# Patient Record
Sex: Female | Born: 1935 | Hispanic: No | Smoking: Never smoker
Health system: Southern US, Community
[De-identification: ages and names within clinical notes are randomized; demographics above are authoritative.]

## PROBLEM LIST (undated history)

## (undated) DIAGNOSIS — Z0000 Encounter for general adult medical examination without abnormal findings: Secondary | ICD-10-CM

## (undated) DIAGNOSIS — G4700 Insomnia, unspecified: Secondary | ICD-10-CM

## (undated) DIAGNOSIS — D649 Anemia, unspecified: Secondary | ICD-10-CM

## (undated) DIAGNOSIS — E785 Hyperlipidemia, unspecified: Secondary | ICD-10-CM

## (undated) DIAGNOSIS — R32 Unspecified urinary incontinence: Secondary | ICD-10-CM

## (undated) DIAGNOSIS — M25531 Pain in right wrist: Secondary | ICD-10-CM

## (undated) DIAGNOSIS — K1120 Sialoadenitis, unspecified: Secondary | ICD-10-CM

## (undated) DIAGNOSIS — Z124 Encounter for screening for malignant neoplasm of cervix: Secondary | ICD-10-CM

## (undated) DIAGNOSIS — F329 Major depressive disorder, single episode, unspecified: Secondary | ICD-10-CM

## (undated) DIAGNOSIS — E538 Deficiency of other specified B group vitamins: Secondary | ICD-10-CM

## (undated) HISTORY — DX: Encounter for screening for malignant neoplasm of cervix: Z12.4

## (undated) HISTORY — PX: CATARACT EXTRACTION: SUR2

## (undated) HISTORY — DX: Anemia, unspecified: D64.9

## (undated) HISTORY — DX: Insomnia, unspecified: G47.00

## (undated) HISTORY — DX: Encounter for general adult medical examination without abnormal findings: Z00.00

## (undated) HISTORY — DX: Major depressive disorder, single episode, unspecified: F32.9

## (undated) HISTORY — DX: Deficiency of other specified B group vitamins: E53.8

## (undated) HISTORY — DX: Unspecified urinary incontinence: R32

## (undated) HISTORY — DX: Hyperlipidemia, unspecified: E78.5

## (undated) HISTORY — DX: Pain in right wrist: M25.531

## (undated) HISTORY — PX: HEMORRHOID SURGERY: SHX153

## (undated) HISTORY — PX: TONSILLECTOMY: SHX5217

## (undated) HISTORY — DX: Sialoadenitis, unspecified: K11.20

---

## 2003-07-17 LAB — HM COLONOSCOPY

## 2006-09-12 ENCOUNTER — Encounter: Payer: Self-pay | Admitting: Family Medicine

## 2009-05-03 ENCOUNTER — Encounter: Payer: Self-pay | Admitting: Family Medicine

## 2009-05-16 LAB — HM MAMMOGRAPHY

## 2009-10-17 ENCOUNTER — Encounter: Payer: Self-pay | Admitting: Family Medicine

## 2010-02-07 ENCOUNTER — Ambulatory Visit: Payer: Self-pay | Admitting: Family Medicine

## 2010-02-07 DIAGNOSIS — M810 Age-related osteoporosis without current pathological fracture: Secondary | ICD-10-CM

## 2010-02-07 DIAGNOSIS — E782 Mixed hyperlipidemia: Secondary | ICD-10-CM | POA: Insufficient documentation

## 2010-02-07 DIAGNOSIS — Z87898 Personal history of other specified conditions: Secondary | ICD-10-CM | POA: Insufficient documentation

## 2010-02-07 DIAGNOSIS — Z8639 Personal history of other endocrine, nutritional and metabolic disease: Secondary | ICD-10-CM

## 2010-02-07 DIAGNOSIS — Z862 Personal history of diseases of the blood and blood-forming organs and certain disorders involving the immune mechanism: Secondary | ICD-10-CM

## 2010-02-07 DIAGNOSIS — I10 Essential (primary) hypertension: Secondary | ICD-10-CM

## 2010-02-15 ENCOUNTER — Telehealth: Payer: Self-pay | Admitting: Family Medicine

## 2010-02-24 ENCOUNTER — Ambulatory Visit: Payer: Self-pay | Admitting: Family Medicine

## 2010-02-27 ENCOUNTER — Telehealth: Payer: Self-pay | Admitting: *Deleted

## 2010-02-27 LAB — CONVERTED CEMR LAB
ALT: 23 units/L (ref 0–35)
Albumin: 4.1 g/dL (ref 3.5–5.2)
BUN: 17 mg/dL (ref 6–23)
Basophils Relative: 0.3 % (ref 0.0–3.0)
CO2: 31 meq/L (ref 19–32)
Chloride: 104 meq/L (ref 96–112)
Cholesterol: 115 mg/dL (ref 0–200)
Eosinophils Relative: 0.5 % (ref 0.0–5.0)
HCT: 36.3 % (ref 36.0–46.0)
LDL Cholesterol: 63 mg/dL (ref 0–99)
Lymphs Abs: 2 10*3/uL (ref 0.7–4.0)
MCV: 91.2 fL (ref 78.0–100.0)
Monocytes Absolute: 0.5 10*3/uL (ref 0.1–1.0)
Potassium: 4.3 meq/L (ref 3.5–5.1)
RBC: 3.98 M/uL (ref 3.87–5.11)
TSH: 2.33 microintl units/mL (ref 0.35–5.50)
Total Protein: 6.4 g/dL (ref 6.0–8.3)
WBC: 5.4 10*3/uL (ref 4.5–10.5)

## 2010-03-03 ENCOUNTER — Ambulatory Visit: Payer: Self-pay | Admitting: Family Medicine

## 2010-03-03 DIAGNOSIS — E559 Vitamin D deficiency, unspecified: Secondary | ICD-10-CM

## 2010-05-18 ENCOUNTER — Ambulatory Visit: Payer: Self-pay | Admitting: Family Medicine

## 2010-05-18 LAB — CONVERTED CEMR LAB
ALT: 21 units/L (ref 0–35)
BUN: 15 mg/dL (ref 6–23)
CO2: 34 meq/L — ABNORMAL HIGH (ref 19–32)
Chloride: 105 meq/L (ref 96–112)
Cholesterol: 132 mg/dL (ref 0–200)
Eosinophils Relative: 0.6 % (ref 0.0–5.0)
Glucose, Bld: 82 mg/dL (ref 70–99)
HCT: 35.5 % — ABNORMAL LOW (ref 36.0–46.0)
Lymphs Abs: 2 10*3/uL (ref 0.7–4.0)
MCV: 91.8 fL (ref 78.0–100.0)
Monocytes Absolute: 0.5 10*3/uL (ref 0.1–1.0)
Platelets: 215 10*3/uL (ref 150.0–400.0)
Potassium: 4.1 meq/L (ref 3.5–5.1)
RDW: 14.6 % (ref 11.5–14.6)
Total Bilirubin: 0.4 mg/dL (ref 0.3–1.2)
WBC: 5.3 10*3/uL (ref 4.5–10.5)

## 2010-05-24 ENCOUNTER — Ambulatory Visit: Payer: Self-pay | Admitting: Family Medicine

## 2010-05-29 ENCOUNTER — Telehealth (INDEPENDENT_AMBULATORY_CARE_PROVIDER_SITE_OTHER): Payer: Self-pay | Admitting: *Deleted

## 2010-07-03 ENCOUNTER — Encounter: Payer: Self-pay | Admitting: Family Medicine

## 2010-08-09 ENCOUNTER — Telehealth (INDEPENDENT_AMBULATORY_CARE_PROVIDER_SITE_OTHER): Payer: Self-pay | Admitting: *Deleted

## 2010-08-15 NOTE — Assessment & Plan Note (Signed)
Summary: TO BE EST/NJR   Vital Signs:  Patient profile:   77 year old female Height:      65.5 inches (166.37 cm) Weight:      134 pounds (60.91 kg) BMI:     22.04 O2 Sat:      98 % on Room air Temp:     98.8 degrees F (37.11 degrees C) oral Pulse rate:   86 / minute BP sitting:   152 / 82  (left arm) Cuff size:   regular  Vitals Entered By: Josph Macho RMA (February 07, 2010 9:35 AM)  O2 Flow:  Room air CC: Establish new pt/ CF Is Patient Diabetic? No   History of Present Illness: Patient in today for new patient appt. Patient generally doing very well. No recent illness/f/c/CP/palp/SOB/GI or GU c/o. Notes occasional very mild constipation for which she uses prunes to good effect. Her biggest issue is her long term physician died suddenly and she needs a PMD and her husband's health continues to fail and she has had to place him in a facility. He has has advanced dementia and Parkinson's Disease and she could no longer manage him. She acknowledges feeling sad and guilty at times but understands that it was necessary.  Preventive Screening-Counseling & Management  Alcohol-Tobacco     Smoking Status: never  Caffeine-Diet-Exercise     Does Patient Exercise: yes  Safety-Violence-Falls     Seat Belt Use: yes      Drug Use:  no.    Current Medications (verified): 1)  Alendronate Sodium 70 Mg Tabs (Alendronate Sodium) .... One Weekly 2)  Hydrochlorothiazide 25 Mg Tabs (Hydrochlorothiazide) .... Once Daily 3)  Diovan 160 Mg Tabs (Valsartan) .... Once Daily 4)  Lipitor 20 Mg Tabs (Atorvastatin Calcium) .... Once Daily At Bedtime 5)  Diltiazem Hcl Er Beads 120 Mg Xr24h-Cap (Diltiazem Hcl Er Beads) .... Once Daily 6)  Klor-Con 10 10 Meq Cr-Tabs (Potassium Chloride) .... Once Daily 7)  Calcium 600mg  + D3 .... 2 in Morning  Allergies (verified): No Known Drug Allergies  Past History:  Past Surgical History: Tonsillectomy Hemorrhoidectomy Cataract extraction b/l  Family  History: Father: deceased@70 , hepatic cancer, cigar smoker Mother: deceased@95 , hip fx @ 85, heart disease with Mitral valve replacement. overweight Siblings:  Brother: deceased@78 , esophageal cancer, smoker, glaucoma MGM: deceased@101 , hip fracture, bladder prolapse MGF: deceased@38 , accidental death PGM: deceased in late 69s, old age PGF: deceased in late 46s, glaucoma, hip fracture Children: Daughter: 46, [employer], early hysterectomy for menometroraghia Son: 3, recurrent DVTs Son: 31, recurrent DVTs Son: 66, [employer]  Social History: Retired from Theatre manager, Marine scientist, homemaker Married, husband now in assisted living due to Parkinson's Disease, Dementia Never Smoked Alcohol use-yes, rare Drug use-no Regular exercise-yes, bicycle Smoking Status:  never Drug Use:  no Does Patient Exercise:  yes Seat Belt Use:  yes Alcohol:  rare  Review of Systems  The patient denies anorexia, fever, weight loss, weight gain, vision loss, decreased hearing, hoarseness, chest pain, syncope, dyspnea on exertion, peripheral edema, prolonged cough, headaches, hemoptysis, abdominal pain, melena, hematochezia, severe indigestion/heartburn, hematuria, incontinence, genital sores, muscle weakness, suspicious skin lesions, transient blindness, difficulty walking, depression, unusual weight change, abnormal bleeding, and enlarged lymph nodes.    Physical Exam  General:  Well-developed,well-nourished,in no acute distress; alert,appropriate and cooperative throughout examination Head:  Normocephalic and atraumatic without obvious abnormalities. Eyes:  No corneal or conjunctival inflammation noted. EOMI. Perrla. Funduscopic exam benign, without  Ears:  External ear exam shows no significant lesions  or deformities.  Otoscopic examination reveals clear canals, tympanic membranes are intact bilaterally without bulging, retraction, inflammation or discharge. Hearing is grossly normal bilaterally. Nose:   External nasal examination shows no deformity or inflammation. Nasal mucosa are pink and moist without lesions or exudates. Mouth:  Oral mucosa and oropharynx without lesions or exudates.  Teeth in good repair. Neck:  No deformities, masses, or tenderness noted. Lungs:  Normal respiratory effort, chest expands symmetrically. Lungs are clear to auscultation, no crackles or wheezes. Heart:  Normal rate and regular rhythm. S1 and S2 normal without gallop, murmur, click, rub or other extra sounds. Abdomen:  Bowel sounds positive,abdomen soft and non-tender without masses, organomegaly or hernias noted. Msk:  No deformity or scoliosis noted of thoracic or lumbar spine.   Pulses:  R and L carotid, dorsalis pedis and posterior tibial pulses are full and equal bilaterally Extremities:  No clubbing, cyanosis, edema, or deformity noted with normal full range of motion of all joints.   Neurologic:  No cranial nerve deficits noted. Station and gait are normal. Plantar reflexes are down-going bilaterally. DTRs are symmetrical throughout. Sensory, motor and coordinative functions appear intact. Skin:  seborrheic keratosis diffusely Cervical Nodes:  No lymphadenopathy noted Psych:  Cognition and judgment appear intact. Alert and cooperative with normal attention span and concentration. No apparent delusions, illusions, hallucinations   Impression & Recommendations:  Problem # 1:  ESSENTIAL HYPERTENSION, BENIGN (ICD-401.1)  Her updated medication list for this problem includes:    Hydrochlorothiazide 25 Mg Tabs (Hydrochlorothiazide) ..... Once daily    Diovan 160 Mg Tabs (Valsartan) ..... Once daily    Diltiazem Hcl Er Beads 120 Mg Xr24h-cap (Diltiazem hcl er beads) ..... Once daily Mild elevation here but reports systolic generally in 130s when checked at home. Avoid sodium and monitor  Problem # 2:  OTHER OSTEOPOROSIS (ICD-733.09)  Her updated medication list for this problem includes:    Alendronate  Sodium 70 Mg Tabs (Alendronate sodium) ..... One weekly Cont med, encouraged her to increase her Calcium Citrate supplement to bid  Problem # 3:  HYPOKALEMIA, HX OF (ICD-V12.2) Check a renal panel prior to next visit and request old records  Problem # 4:  MIGRAINES, HX OF (ICD-V13.8) No trouble since age 13 likely hormone mediated. Will monitor  Problem # 5:  MIXED HYPERLIPIDEMIA (ICD-272.2)  Her updated medication list for this problem includes:    Lipitor 20 Mg Tabs (Atorvastatin calcium) ..... Once daily at bedtime Avoid trans fats, request old records and cont current meds til next lab work completed  Complete Medication List: 1)  Alendronate Sodium 70 Mg Tabs (Alendronate sodium) .... One weekly 2)  Hydrochlorothiazide 25 Mg Tabs (Hydrochlorothiazide) .... Once daily 3)  Diovan 160 Mg Tabs (Valsartan) .... Once daily 4)  Lipitor 20 Mg Tabs (Atorvastatin calcium) .... Once daily at bedtime 5)  Diltiazem Hcl Er Beads 120 Mg Xr24h-cap (Diltiazem hcl er beads) .... Once daily 6)  Klor-con 10 10 Meq Cr-tabs (Potassium chloride) .... Once daily 7)  Calcium 600mg  + D3  .... 2 in morning  Patient Instructions: 1)  Please schedule a follow-up appointment in 1 month.  2)  Limit your Sodium(salt) .  3)  BMP prior to visit, ICD-9: 401.1 4)  Hepatic Panel prior to visit ICD-9: 272.0 5)  Lipid panel prior to visit ICD-9 :272.0  6)  TSH prior to visit ICD-9 : 401.1 7)  CBC w/ Diff prior to visit ICD-9 : 401.1 8)  Release of Records if  not already done Dr Jerelyn Scott in [facility]  Preventive Care Screening  Hemoccult:    Date:  05/16/2009    Results:  historical   Mammogram:    Date:  05/16/2009    Results:  historical   Pap Smear:    Date:  05/16/2009    Results:  historical   Last Flu Shot:    Date:  04/15/2009    Results:  hisotrical   Bone Density:    Date:  07/17/2003    Results:  historical std dev  Colonoscopy:    Date:  07/17/2003    Results:  historical

## 2010-08-15 NOTE — Progress Notes (Signed)
Summary: wants screening mammogram  Phone Note Call from Patient Call back at Work Phone [phone]   Caller: Patient---live call Reason for Call: Referral Summary of Call: [facility] please send [facility]. Initial call taken by: Warnell Forester,  May 29, 2010 9:04 AM  Follow-up for Phone Call        Just need to know if she has a preference for [facility] or [facility]? If no preference will do [facility] and can order Follow-up by: Danise Edge MD,  May 29, 2010 9:52 AM  Additional Follow-up for Phone Call Additional follow up Details #1::        Patient states she would like to go to [facility] Additional Follow-up by: Josph Macho RMA,  May 29, 2010 1:29 PM    Additional Follow-up for Phone Call Additional follow up Details #2::    Order has been written and is ready for p/u.  Follow-up by: Danise Edge MD,  May 29, 2010 2:12 PM  Additional Follow-up for Phone Call Additional follow up Details #3:: Details for Additional Follow-up Action Taken: Mailed out referral per pt Additional Follow-up by: Josph Macho RMA,  May 29, 2010 2:25 PM

## 2010-08-15 NOTE — Letter (Signed)
Summary: Imaging Reports -[facility] -2005 - 2010  Imaging Reports -[facility] -2005 - 2010   Imported By: Maryln Gottron 03/10/2010 10:29:34  _____________________________________________________________________  External Attachment:    Type:   Image     Comment:   External Document

## 2010-08-15 NOTE — Assessment & Plan Note (Signed)
Summary: 3 month rov/njr   Vital Signs:  Patient profile:   77 year old female Height:      65.5 inches (166.37 cm) Weight:      133.31 pounds (60.60 kg) O2 Sat:      98 % on Room air Temp:     98.2 degrees F (36.78 degrees C) oral Pulse rate:   94 / minute BP sitting:   116 / 62  (left arm) Cuff size:   regular  Vitals Entered By: Josph Macho RMA (May 24, 2010 2:29 PM)  O2 Flow:  Room air CC: 3 month follow up/ CF Is Patient Diabetic? No   History of Present Illness: patient is an 77 year old female in today for evaluation of her medical problems. She denies any acute complaints she is feeling well. 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 She goes daily to his facility and cares for him 14-16 hours a day. She says her health is good she is eating better. Her complaint is of some intermittent left elbow painat her lateral epicondyle secondary to some heavy lifting and shifting of her husband. No swelling, erythema is noted no other injury. She says she feels well no chest pain, palpitations, shortness of breath, fevers, chills, GI or GU complaints and she says she checks her blood pressure at home and has seen a  Current Medications (verified): 1)  Diovan 160 Mg Tabs (Valsartan) .... Once Daily 2)  Diltiazem Hcl Er Beads 120 Mg Xr24h-Cap (Diltiazem Hcl Er Beads) .... Once Daily 3)  Calcium 600mg  + D3 .... 2 in Morning 4)  Lipitor 10 Mg Tabs (Atorvastatin Calcium) 5)  Ra Fish Oil 1000 Mg Caps (Omega-3 Fatty Acids) .Marland Kitchen.. 1 Tab By Mouth Daily  Allergies (verified): No Known Drug Allergies  Past History:  Past medical history reviewed for relevance to current acute and chronic problems. Social history (including risk factors) reviewed for relevance to current acute and chronic problems.  Social History: Reviewed history from 02/07/2010 and no changes required. Retired from Theatre manager, Marine scientist,  homemaker Married, husband now in assisted living due to Parkinson's Disease, Dementia Never Smoked Alcohol use-yes, rare Drug use-no Regular exercise-yes, bicycle  Review of Systems      See HPI       Flu Vaccine Consent Questions     Do you have a history of severe allergic reactions to this vaccine? no    Any prior history of allergic reactions to egg and/or gelatin? no    Do you have a sensitivity to the preservative Thimersol? no    Do you have a past history of Guillan-Barre Syndrome? no    Do you currently have an acute febrile illness? no    Have you ever had a severe reaction to latex? no    Vaccine information given and explained to patient? yes    Are you currently pregnant? no    Lot Number:[id number]   Exp Date:01/13/2011   Site Given  Left Deltoid IM Josph Macho RMA  May 24, 2010 2:30 PM   Physical Exam  General:  Well-developed,well-nourished,in no acute distress; alert,appropriate and cooperative throughout examination Head:  Normocephalic and atraumatic without obvious abnormalities. No apparent alopecia or balding. Mouth:  Oral mucosa and oropharynx without lesions or exudates.  Teeth in good repair. Neck:  No deformities, masses, or tenderness noted. Lungs:  Normal respiratory effort, chest expands symmetrically. Lungs are clear to auscultation, no crackles or  wheezes. Heart:  Normal rate and regular rhythm. S1 and S2 normal without gallop, murmur, click, rub or other extra sounds. Abdomen:  Bowel sounds positive,abdomen soft and non-tender without masses, organomegaly or hernias noted. Extremities:  No clubbing, cyanosis, edema, or deformity noted with normal full range of motion of all joints.   Cervical Nodes:  No lymphadenopathy noted Psych:  Cognition and judgment appear intact. Alert and cooperative with normal attention span and concentration. No apparent delusions, illusions, hallucinations   Impression & Recommendations:  Problem # 1:   MIXED HYPERLIPIDEMIA (ICD-272.2)  Her updated medication list for this problem includes:    Lipitor 10 Mg Tabs (Atorvastatin calcium) Good control, avoid trans fats, cont fish oil  Problem # 2:  ESSENTIAL HYPERTENSION, BENIGN (ICD-401.1)  Her updated medication list for this problem includes:    Diovan 160 Mg Tabs (Valsartan) ..... Once daily    Diltiazem Hcl Er Beads 120 Mg Xr24h-cap (Diltiazem hcl er beads) .Marland Kitchen... 1 tab by mouth once daily    Hydrochlorothiazide 25 Mg Tabs (Hydrochlorothiazide) .Marland Kitchen... 1 tab by mouth daily Well controlled no changes  Problem # 3:  OTHER OSTEOPOROSIS (ICD-733.09)  The following medications were removed from the medication list:    Alendronate Sodium 70 Mg Tabs (Alendronate sodium) ..... One weekly Has been on the above medication for over 10 years so agree with stopping med at this time, continuing calcium supplements and monitoring bone density.  Complete Medication List: 1)  Diovan 160 Mg Tabs (Valsartan) .... Once daily 2)  Diltiazem Hcl Er Beads 120 Mg Xr24h-cap (Diltiazem hcl er beads) .Marland Kitchen.. 1 tab by mouth once daily 3)  Calcium 600mg  + D3  .... 2 in morning 4)  Lipitor 10 Mg Tabs (Atorvastatin calcium) 5)  Ra Fish Oil 1000 Mg Caps (Omega-3 fatty acids) .Marland Kitchen.. 1 tab by mouth daily 6)  Hydrochlorothiazide 25 Mg Tabs (Hydrochlorothiazide) .Marland Kitchen.. 1 tab by mouth daily  Other Orders: Flu Vaccine 44yrs + MEDICARE PATIENTS (I4332) Administration Flu vaccine - MCR (R5188)  Patient Instructions: 1)  Please schedule a follow-up appointment in 3 months 2)   or as needed Prescriptions: HYDROCHLOROTHIAZIDE 25 MG TABS (HYDROCHLOROTHIAZIDE) 1 tab by mouth daily  #90 x 0   Entered and Authorized by:   Danise Edge MD   Signed by:   Danise Edge MD on 05/24/2010   Method used:   Faxed to ...       [facility] (retail)       [address]       [place], [place]  [address]  [place]       Ph: [phone]       Fax: [phone]   RxID:    [id number] DILTIAZEM HCL ER BEADS 120 MG XR24H-CAP (DILTIAZEM HCL ER BEADS) 1 tab by mouth once daily  #90 x 0   Entered and Authorized by:   Danise Edge MD   Signed by:   Danise Edge MD on 05/24/2010   Method used:   Faxed to ...       [facility] (retail)       [address]       [place], [place]  [address]  [place]       Ph: [phone]       Fax: [phone]   RxID:   [id number]    Orders Added: 1)  Flu Vaccine 8yrs + MEDICARE PATIENTS [Q2039] 2)  Administration Flu vaccine - MCR [G0008] 3)  Est. Patient Level IV [89381]

## 2010-08-15 NOTE — Progress Notes (Signed)
Summary: refills  Phone Note Refill Request Call back at Work Phone [phone]   Refills Requested: Medication #1:  ALENDRONATE SODIUM 70 MG TABS one weekly  Medication #2:  HYDROCHLOROTHIAZIDE 25 MG TABS once daily send to [facility]  Initial call taken by: Warnell Forester,  February 15, 2010 12:41 PM    Prescriptions: HYDROCHLOROTHIAZIDE 25 MG TABS (HYDROCHLOROTHIAZIDE) once daily  #90 x 1   Entered by:   Josph Macho RMA   Authorized by:   Danise Edge MD   Signed by:   Josph Macho RMA on 02/15/2010   Method used:   Faxed to ...       [facility] (mail-order)             , [place]         Ph: [phone]       Fax: [phone]   RxID:   [id number] ALENDRONATE SODIUM 70 MG TABS (ALENDRONATE SODIUM) one weekly  #12 x 1   Entered by:   Josph Macho RMA   Authorized by:   Danise Edge MD   Signed by:   Josph Macho RMA on 02/15/2010   Method used:   Faxed to ...       [facility] (mail-order)             , [place]         Ph: [phone]       Fax: [phone]   RxID:   [id number]

## 2010-08-15 NOTE — Letter (Signed)
Summary: Office [facility]- 2005 - 2010  Office [facility]- 2005 - 2010   Imported By: Maryln Gottron 03/10/2010 10:20:13  _____________________________________________________________________  External Attachment:    Type:   Image     Comment:   External Document

## 2010-08-15 NOTE — Progress Notes (Signed)
Summary: refill  Phone Note Refill Request Call back at Home Phone [phone]   Refills Requested: Medication #1:  DIOVAN 160 MG TABS once daily send to [facility]  Initial call taken by: Warnell Forester,  February 15, 2010 4:27 PM    Prescriptions: DIOVAN 160 MG TABS (VALSARTAN) once daily  #90 x 1   Entered by:   Josph Macho RMA   Authorized by:   Danise Edge MD   Signed by:   Josph Macho RMA on 02/15/2010   Method used:   Faxed to ...       [facility] (mail-order)             , [place]         Ph: [phone]       Fax: [phone]   RxID:   [id number]

## 2010-08-15 NOTE — Letter (Signed)
Summary: Lab Reports - [facility] 2005 - 2011  Lab Reports - [facility] 2005 - 2011   Imported By: Maryln Gottron 03/10/2010 10:23:03  _____________________________________________________________________  External Attachment:    Type:   Image     Comment:   External Document

## 2010-08-15 NOTE — Assessment & Plan Note (Signed)
Summary: 1 month fup//ccm   Vital Signs:  Patient profile:   77 year old female Height:      65.5 inches (166.37 cm) Weight:      132 pounds (60.00 kg) O2 Sat:      98 % on Room air Temp:     98.7 degrees F (37.06 degrees C) oral Pulse rate:   93 / minute BP sitting:   110 / 62  (left arm)  Vitals Entered By: Josph Macho RMA (March 03, 2010 2:05 PM)  O2 Flow:  Room air CC: 1 month follow up/ CF Is Patient Diabetic? No   History of Present Illness: Patient in today for follow up of her new patient appointment. She is feeling well. No recent illness/f/c/malaise/CP/palp/SOB/GI or GU c/o. Review of her old records and discussion with the patient tody reveal she has been on Fosamax and/or Alendronate for over 10 years and has not had a bone scan in many years.   Current Medications (verified): 1)  Alendronate Sodium 70 Mg Tabs (Alendronate Sodium) .... One Weekly 2)  Hydrochlorothiazide 25 Mg Tabs (Hydrochlorothiazide) .... Once Daily 3)  Diovan 160 Mg Tabs (Valsartan) .... Once Daily 4)  Lipitor 20 Mg Tabs (Atorvastatin Calcium) .... Once Daily At Bedtime 5)  Diltiazem Hcl Er Beads 120 Mg Xr24h-Cap (Diltiazem Hcl Er Beads) .... Once Daily 6)  Klor-Con 10 10 Meq Cr-Tabs (Potassium Chloride) .... Once Daily 7)  Calcium 600mg  + D3 .... 2 in Morning  Allergies (verified): No Known Drug Allergies  Past History:  Past medical history reviewed for relevance to current acute and chronic problems. Social history (including risk factors) reviewed for relevance to current acute and chronic problems.  Social History: Reviewed history from 02/07/2010 and no changes required. 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 No apparent alopecia or balding. Mouth:  Oral mucosa and oropharynx without lesions or exudates.  Teeth in good repair. Neck:  No deformities, masses, or tenderness noted. Lungs:  Normal respiratory effort, chest expands symmetrically. Lungs are clear to auscultation, no crackles or wheezes. Heart:  Normal rate and regular rhythm. S1 and S2 normal without gallop, murmur, click, rub or other extra sounds. Abdomen:  Bowel sounds positive,abdomen soft and non-tender without masses, organomegaly or hernias noted. Msk:  No deformity or scoliosis noted of thoracic or lumbar spine.   Extremities:  No clubbing, cyanosis, edema, or deformity noted    Skin:  Intact without suspicious lesions or rashes Cervical Nodes:  No lymphadenopathy noted Psych:  Cognition and judgment appear intact. Alert and cooperative with normal attention span and concentration. No apparent delusions, illusions, hallucinations   Impression & Recommendations:  Problem # 1:  MIXED HYPERLIPIDEMIA (ICD-272.2)  The following medications were removed from the medication list:    Lipitor 20 Mg Tabs (Atorvastatin calcium) ..... Once daily at bedtime Her updated medication list for this problem includes:    Lipitor 10 Mg Tabs (Atorvastatin calcium) Good control will decrease Lipitor dose and add a fish oil cap. Avoid trans fats and recheck level in 3 months  Problem # 2:  ESSENTIAL HYPERTENSION, BENIGN (ICD-401.1)  The following medications were removed from the medication list:  Hydrochlorothiazide 25 Mg Tabs (Hydrochlorothiazide) ..... Once daily Her updated medication list for this problem includes:    Diovan 160 Mg Tabs (Valsartan) ..... Once daily    Diltiazem Hcl Er Beads 120 Mg Xr24h-cap (Diltiazem hcl er beads) ..... Once daily Well controlled patient would like to decrease  meds, will try to d/c HCTZ and reeval at next visit  Problem # 3:  HYPOKALEMIA, HX OF (ICD-V12.2) Related to HCTZ use, will d/c and check renal with next visit  Problem # 4:  OTHER OSTEOPOROSIS (ICD-733.09)  Her updated medication list for this problem includes:    Alendronate Sodium 70 Mg Tabs (Alendronate sodium) ..... One weekly Records note osteopenia, will repeat Bone densitometry and then likely stop Alendronate cont Ca and vit D supplements  Complete Medication List: 1)  Alendronate Sodium 70 Mg Tabs (Alendronate sodium) .... One weekly 2)  Diovan 160 Mg Tabs (Valsartan) .... Once daily 3)  Diltiazem Hcl Er Beads 120 Mg Xr24h-cap (Diltiazem hcl er beads) .... Once daily 4)  Calcium 600mg  + D3  .... 2 in morning 5)  Lipitor 10 Mg Tabs (Atorvastatin calcium) 6)  Ra Fish Oil 1000 Mg Caps (Omega-3 fatty acids) .Marland Kitchen.. 1 tab by mouth daily  Other Orders: T-Bone Densitometry [phone])  Patient Instructions: 1)  Please schedule a follow-up appointment in 3 months .  2)  BMP prior to visit, ICD-9: 401.1 3)  Hepatic Panel prior to visit ICD-9: 401.1 4)  Lipid panel prior to visit ICD-9 : 272.2 5)  CBC w/ Diff prior to visit ICD-9 : 401.1 6)  Stop Hydrochlorthiazide and potassium, split Lipitor in 1/2. 7)  Start one fish oil cap daily

## 2010-08-15 NOTE — Letter (Signed)
Summary: EKG, Echo [facility] -2008  EKG, Echo [facility] -2008   Imported By: Maryln Gottron 03/10/2010 10:25:52  _____________________________________________________________________  External Attachment:    Type:   Image     Comment:   External Document

## 2010-08-15 NOTE — Progress Notes (Signed)
Summary: rtc  Phone Note Call from Patient Call back at Home Phone [phone] Call back at Work Phone [phone]   Caller: Patient Call For: Lori Edge MD Summary of Call: pt is return Lori Bright call  Initial call taken by: Heron Sabins,  February 27, 2010 12:03 PM  Follow-up for Phone Call        Pt aware of results. Follow-up by: Romualdo Bolk, CMA Duncan Dull),  February 27, 2010 2:47 PM

## 2010-08-17 NOTE — Progress Notes (Signed)
Summary: Lab work  Phone Note Call from Patient Call back at [facility] [phone]   Summary of Call: Pt would like to know if she needs lab work done before next appt? If so can we put the orders in for [facility] labs a week before appt. Call pt back and let her know. Initial call taken by: Josph Macho RMA,  August 09, 2010 9:14 AM  Follow-up for Phone Call        No labs this time, her labs were very good last time, will check a CBC again due to her very mild anemia but not until next vist.  Follow-up by: Danise Edge MD,  August 09, 2010 12:01 PM  Additional Follow-up for Phone Call Additional follow up Details #1::        Pt informed Additional Follow-up by: Josph Macho RMA,  August 09, 2010 1:12 PM

## 2010-08-28 ENCOUNTER — Telehealth: Payer: Self-pay | Admitting: Family Medicine

## 2010-08-31 ENCOUNTER — Telehealth: Payer: Self-pay | Admitting: Family Medicine

## 2010-09-06 NOTE — Progress Notes (Signed)
Summary: Lipitor refill  Phone Note Refill Request Message from:  Patient on August 28, 2010 2:52 PM  Refills Requested: Medication #1:  LIPITOR 10 MG TABS   Dosage confirmed as above?Dosage Confirmed Pt wants paper RX  Initial call taken by: Josph Macho RMA,  August 28, 2010 2:52 PM    Prescriptions: LIPITOR 10 MG TABS (ATORVASTATIN CALCIUM)   #30 x 5   Entered by:   Josph Macho RMA   Authorized by:   Danise Edge MD   Signed by:   Josph Macho RMA on 08/28/2010   Method used:   Print then Give to Patient   RxID:   [id number] LIPITOR 10 MG TABS (ATORVASTATIN CALCIUM)   #30 x 5   Entered by:   Josph Macho RMA   Authorized by:   Danise Edge MD   Signed by:   Josph Macho RMA on 08/28/2010   Method used:   Faxed to ...       [facility] (mail-order)             , [place]         Ph: [phone]       Fax: [phone]   RxID:   [id number]   Appended Document: Lipitor refill RX sent to [facility] was cancelled and paper copy was given to pt

## 2010-09-06 NOTE — Progress Notes (Signed)
Summary: Lipitor refill to [facility] per pt  Phone Note Refill Request   Refills Requested: Medication #1:  LIPITOR 10 MG TABS   Dosage confirmed as above?Dosage Confirmed Pt called stating she wants generic Lipitor sent to [facility]   Initial call taken by: Josph Macho RMA,  August 31, 2010 10:56 AM Initial call taken by: Josph Macho RMA,  August 31, 2010 10:56 AM    Prescriptions: LIPITOR 10 MG TABS (ATORVASTATIN CALCIUM)   #30 x 5   Entered by:   Josph Macho RMA   Authorized by:   Danise Edge MD   Signed by:   Josph Macho RMA on 08/31/2010   Method used:   Faxed to ...       [facility] (mail-order)             , [place]         Ph: [phone]       Fax: [phone]   RxID:   [id number]

## 2010-09-19 ENCOUNTER — Telehealth: Payer: Self-pay | Admitting: Family Medicine

## 2010-09-26 NOTE — Progress Notes (Signed)
Summary: Diovan refill  Phone Note Refill Request Message from:  Fax from Pharmacy on September 19, 2010 9:28 AM  Refills Requested: Medication #1:  DIOVAN 160 MG TABS once daily   Dosage confirmed as above?Dosage Confirmed Initial call taken by: Josph Macho RMA,  September 19, 2010 9:29 AM    Prescriptions: DIOVAN 160 MG TABS (VALSARTAN) once daily  #90 x 1   Entered by:   Josph Macho RMA   Authorized by:   Danise Edge MD   Signed by:   Josph Macho RMA on 09/19/2010   Method used:   Faxed to ...       [facility] (mail-order)             , [place]         Ph: [phone]       Fax: [phone]   RxID:   [id number]

## 2010-09-28 ENCOUNTER — Telehealth: Payer: Self-pay | Admitting: Family Medicine

## 2010-10-03 NOTE — Progress Notes (Signed)
Summary: Diltiazem refill  Phone Note Refill Request  on September 28, 2010 8:17 AM  Refills Requested: Medication #1:  DILTIAZEM HCL ER BEADS 120 MG XR24H-CAP 1 tab by mouth once daily Initial call taken by: Josph Macho RMA,  September 28, 2010 8:17 AM    Prescriptions: DILTIAZEM HCL ER BEADS 120 MG XR24H-CAP (DILTIAZEM HCL ER BEADS) 1 tab by mouth once daily  #90 x 1   Entered by:   Josph Macho RMA   Authorized by:   Danise Edge MD   Signed by:   Josph Macho RMA on 09/28/2010   Method used:   Faxed to ...       [facility] (mail-order)             , [place]         Ph: [phone]       Fax: [phone]   RxID:   [id number]

## 2010-11-01 ENCOUNTER — Encounter: Payer: Self-pay | Admitting: Family Medicine

## 2010-11-01 ENCOUNTER — Other Ambulatory Visit: Payer: Self-pay

## 2010-12-05 ENCOUNTER — Other Ambulatory Visit: Payer: Self-pay

## 2010-12-05 MED ORDER — HYDROCHLOROTHIAZIDE 25 MG PO TABS: 25.0000 mg | ORAL_TABLET | Freq: Every day | ORAL | Status: DC

## 2011-03-22 ENCOUNTER — Other Ambulatory Visit: Payer: Self-pay

## 2011-03-22 MED ORDER — DILTIAZEM HCL 120 MG PO CP24: 120.0000 mg | ORAL_CAPSULE | Freq: Every day | ORAL | Status: DC

## 2011-03-22 MED ORDER — VALSARTAN 160 MG PO TABS: 160.0000 mg | ORAL_TABLET | Freq: Every day | ORAL | Status: DC

## 2011-03-22 MED ORDER — ATORVASTATIN CALCIUM 10 MG PO TABS: 10.0000 mg | ORAL_TABLET | Freq: Every day | ORAL | Status: DC

## 2011-03-30 ENCOUNTER — Other Ambulatory Visit (HOSPITAL_COMMUNITY)
Admission: RE | Admit: 2011-03-30 | Discharge: 2011-03-30 | Disposition: A | Discharge status: Home / Self Care | Payer: Medicare Other | Source: Ambulatory Visit | Attending: Family Medicine | Admitting: Family Medicine

## 2011-03-30 ENCOUNTER — Ambulatory Visit (INDEPENDENT_AMBULATORY_CARE_PROVIDER_SITE_OTHER): Payer: Medicare Other | Admitting: Family Medicine

## 2011-03-30 ENCOUNTER — Encounter: Payer: Self-pay | Admitting: Family Medicine

## 2011-03-30 ENCOUNTER — Other Ambulatory Visit: Payer: Self-pay | Admitting: Family Medicine

## 2011-03-30 VITALS — BP 131/74 | HR 88 | Temp 98.9°F | Ht 65.5 in | Wt 137.0 lb

## 2011-03-30 DIAGNOSIS — M25539 Pain in unspecified wrist: Secondary | ICD-10-CM

## 2011-03-30 DIAGNOSIS — I10 Essential (primary) hypertension: Secondary | ICD-10-CM

## 2011-03-30 DIAGNOSIS — Z124 Encounter for screening for malignant neoplasm of cervix: Secondary | ICD-10-CM

## 2011-03-30 DIAGNOSIS — E785 Hyperlipidemia, unspecified: Secondary | ICD-10-CM

## 2011-03-30 DIAGNOSIS — Z23 Encounter for immunization: Secondary | ICD-10-CM

## 2011-03-30 DIAGNOSIS — E782 Mixed hyperlipidemia: Secondary | ICD-10-CM

## 2011-03-30 DIAGNOSIS — M25531 Pain in right wrist: Secondary | ICD-10-CM

## 2011-03-30 NOTE — Patient Instructions (Signed)

## 2011-03-31 LAB — LIPID PANEL
Cholesterol: 143 mg/dL (ref 0–200)
LDL Cholesterol: 67 mg/dL (ref 0–99)
Triglycerides: 157 mg/dL — ABNORMAL HIGH (ref <150)

## 2011-03-31 LAB — BASIC METABOLIC PANEL
BUN: 17 mg/dL (ref 6–23)
Chloride: 102 mEq/L (ref 96–112)
Creat: 0.6 mg/dL (ref 0.50–1.10)

## 2011-03-31 LAB — HEPATIC FUNCTION PANEL
ALT: 14 U/L (ref 0–35)
AST: 20 U/L (ref 0–37)
Alkaline Phosphatase: 63 U/L (ref 39–117)
Total Protein: 6.9 g/dL (ref 6.0–8.3)

## 2011-03-31 LAB — CBC
HCT: 37.3 % (ref 36.0–46.0)
MCHC: 30.3 g/dL (ref 30.0–36.0)
MCV: 92.3 fL (ref 78.0–100.0)
RDW: 14.5 % (ref 11.5–15.5)

## 2011-04-02 MED ORDER — FERROUS FUMARATE-FOLIC ACID 324-1 MG PO TABS: 1.0000 | ORAL_TABLET | Freq: Every day | ORAL | Status: DC

## 2011-04-02 NOTE — Progress Notes (Signed)
Addended by: Court Joy on: 04/02/2011 10:11 AM   Modules accepted: Orders

## 2011-04-03 ENCOUNTER — Encounter: Payer: Self-pay | Admitting: Family Medicine

## 2011-04-03 DIAGNOSIS — Z124 Encounter for screening for malignant neoplasm of cervix: Secondary | ICD-10-CM

## 2011-04-03 DIAGNOSIS — M25531 Pain in right wrist: Secondary | ICD-10-CM

## 2011-04-03 HISTORY — DX: Pain in right wrist: M25.531

## 2011-04-03 HISTORY — DX: Encounter for screening for malignant neoplasm of cervix: Z12.4

## 2011-04-03 NOTE — Assessment & Plan Note (Signed)
Mild strain, try ice and Aspercreme bid and splint as much as possible, report if symptoms worsen

## 2011-04-03 NOTE — Assessment & Plan Note (Signed)
Well controlled at today's visit. 

## 2011-04-03 NOTE — Progress Notes (Signed)
Lori Bright [id number] 08/26/1923 04/03/2011      Progress Note-Follow Up  Subjective  Chief Complaint  Chief Complaint  Patient presents with  . Annual Exam    physical  . Gynecologic Exam    pap smear    HPI  Patient is a 77 year old Caucasian female who is in today for GYN exam and annual followup. Is tolerating her atorvastatin. Denies any recent illness, fevers, chills, headache, chest pain, palpitations, shortness of breath, myalgias, GI or GU concerns. She uses a pessary for some mild bladder prolapse and tolerates that well. She G4 P4 status post 4 spontaneous vaginal deliveries menarche was age 43 menopause around 45 generally she was regular with those years. She denies any history of abnormal Paps or abnormal mammograms and offers no GYN complaints such as discharge lesions or pain today. She has been having some trouble intermittently with right wrist pain she believes from overuse. No falls, warmth or redness.  Past Medical History  Diagnosis Date  . Cervical cancer screening 04/03/2011  . Wrist pain, right 04/03/2011    Past Surgical History  Procedure Date  . Tonsillectomy   . Hemorrhoid surgery   . Cataract extraction     b/l    Family History  Problem Relation Age of Onset  . Hip fracture Mother 52  . Heart disease Mother     w/ mitral valve replacement  . Obesity Mother   . Cancer Father     hepatic/ cigar smoker  . Cancer Brother     esophageal/ smoker  . Glaucoma Brother   . Other Daughter     early hysterectomy for menometroraghia  . Deep vein thrombosis Son   . Hip fracture Maternal Grandmother   . Other Maternal Grandmother     bladder prolapse  . Hip fracture Paternal Grandfather   . Glaucoma Paternal Grandfather   . Deep vein thrombosis Son     History   Social History  . Marital Status: Married    Spouse Name: N/A    Number of Children: N/A  . Years of Education: N/A   Occupational History  . Not on file.   Social History Main  Topics  . Smoking status: Never Smoker   . Smokeless tobacco: Never Used  . Alcohol Use: Yes     rare  . Drug Use: No  . Sexually Active: Not on file   Other Topics Concern  . Not on file   Social History Narrative  . No narrative on file    Current Outpatient Prescriptions on File Prior to Visit  Medication Sig Dispense Refill  . atorvastatin (LIPITOR) 10 MG tablet Take 1 tablet (10 mg total) by mouth daily.  90 tablet  1  . calcium carbonate (OS-CAL) 600 MG TABS Take 600 mg by mouth daily. Take 2       . diltiazem (DILACOR XR) 120 MG 24 hr capsule Take 1 capsule (120 mg total) by mouth daily.  90 capsule  1  . fish oil-omega-3 fatty acids 1000 MG capsule Take 2 g by mouth daily.        . hydrochlorothiazide 25 MG tablet Take 1 tablet (25 mg total) by mouth daily.  90 tablet  2  . valsartan (DIOVAN) 160 MG tablet Take 1 tablet (160 mg total) by mouth daily.  90 tablet  1    No Known Allergies  Review of Systems  Review of Systems  Constitutional: Negative for fever and malaise/fatigue.  HENT: Negative for  ear pain, congestion and sore throat.   Eyes: Negative for blurred vision and discharge.  Respiratory: Negative for cough, shortness of breath and wheezing.   Cardiovascular: Negative for chest pain, palpitations and leg swelling.  Gastrointestinal: Negative for heartburn, nausea, abdominal pain, diarrhea, constipation, blood in stool and melena.  Genitourinary: Negative for dysuria.  Musculoskeletal: Negative for falls.  Skin: Negative for rash.  Neurological: Negative for dizziness, tremors, focal weakness, loss of consciousness, weakness and headaches.  Endo/Heme/Allergies: Negative for polydipsia.  Psychiatric/Behavioral: Negative for depression and suicidal ideas. The patient is not nervous/anxious and does not have insomnia.     Objective  BP 131/74  Pulse 88  Temp(Src) 98.9 F (37.2 C) (Oral)  Ht 5' 5.5" (1.664 m)  Wt 137 lb (62.143 kg)  BMI 22.45 kg/m2   SpO2 96%  Physical Exam  Physical Exam  Constitutional: She is oriented to person, place, and time and well-developed, well-nourished, and in no distress. No distress.  HENT:  Head: Normocephalic and atraumatic.  Eyes: Conjunctivae are normal.  Neck: Neck supple. No thyromegaly present.  Cardiovascular: Normal rate, regular rhythm and normal heart sounds.   No murmur heard. Pulmonary/Chest: Effort normal and breath sounds normal. She has no wheezes.  Abdominal: She exhibits no distension and no mass.  Genitourinary: Vagina normal, uterus normal, cervix normal, right adnexa normal and left adnexa normal. No vaginal discharge found.  Musculoskeletal: She exhibits no edema.  Lymphadenopathy:    She has no cervical adenopathy.  Neurological: She is alert and oriented to person, place, and time.  Skin: Skin is warm and dry. No rash noted. She is not diaphoretic.  Psychiatric: Memory, affect and judgment normal.    Lab Results  Component Value Date   TSH 3.749 03/30/2011   Lab Results  Component Value Date   WBC 6.5 03/30/2011   HGB 11.3* 03/30/2011   HCT 37.3 03/30/2011   MCV 92.3 03/30/2011   PLT 283 03/30/2011   Lab Results  Component Value Date   CREATININE 0.60 03/30/2011   BUN 17 03/30/2011   NA 140 03/30/2011   K 4.1 03/30/2011   CL 102 03/30/2011   CO2 26 03/30/2011   Lab Results  Component Value Date   ALT 14 03/30/2011   AST 20 03/30/2011   ALKPHOS 63 03/30/2011   BILITOT 0.3 03/30/2011   Lab Results  Component Value Date   CHOL 143 03/30/2011   Lab Results  Component Value Date   HDL 45 03/30/2011   Lab Results  Component Value Date   LDLCALC 67 03/30/2011   Lab Results  Component Value Date   TRIG 157* 03/30/2011   Lab Results  Component Value Date   CHOLHDL 3.2 03/30/2011     Assessment & Plan  ESSENTIAL HYPERTENSION, BENIGN Well controlled at today's visit  Cervical cancer screening Pap smear performed today  Exam unremarkable  MIXED  HYPERLIPIDEMIA Tolerating meds, triglycerides up slightly, minimize simple carbs and consider fish oil caps  Wrist pain, right Mild strain, try ice and Aspercreme bid and splint as much as possible, report if symptoms worsen

## 2011-04-03 NOTE — Assessment & Plan Note (Signed)
Pap smear performed today  Exam unremarkable

## 2011-04-03 NOTE — Assessment & Plan Note (Signed)
Tolerating meds, triglycerides up slightly, minimize simple carbs and consider fish oil caps

## 2011-04-04 ENCOUNTER — Other Ambulatory Visit: Payer: Self-pay

## 2011-04-04 MED ORDER — FERROUS FUMARATE-FOLIC ACID 324-1 MG PO TABS: 1.0000 | ORAL_TABLET | Freq: Every day | ORAL | Status: DC

## 2011-04-04 NOTE — Telephone Encounter (Signed)
Pt would like all meds to go to [facility]

## 2011-05-03 ENCOUNTER — Other Ambulatory Visit: Payer: Medicare Other

## 2011-05-03 ENCOUNTER — Other Ambulatory Visit (INDEPENDENT_AMBULATORY_CARE_PROVIDER_SITE_OTHER): Payer: Medicare Other

## 2011-05-03 DIAGNOSIS — E785 Hyperlipidemia, unspecified: Secondary | ICD-10-CM

## 2011-05-03 DIAGNOSIS — D649 Anemia, unspecified: Secondary | ICD-10-CM

## 2011-05-03 LAB — CBC
HCT: 38.9 % (ref 36.0–46.0)
MCH: 29.4 pg (ref 26.0–34.0)
MCHC: 31.1 g/dL (ref 30.0–36.0)
MCV: 94.6 fL (ref 78.0–100.0)
RDW: 15.1 % (ref 11.5–15.5)

## 2011-05-03 LAB — LIPID PANEL: LDL Cholesterol: 62 mg/dL (ref 0–99)

## 2011-08-30 ENCOUNTER — Other Ambulatory Visit: Payer: Self-pay | Admitting: Family Medicine

## 2011-09-13 ENCOUNTER — Other Ambulatory Visit: Payer: Self-pay | Admitting: Family Medicine

## 2011-09-24 ENCOUNTER — Ambulatory Visit: Payer: Medicare Other | Admitting: Family Medicine

## 2011-09-26 ENCOUNTER — Ambulatory Visit: Payer: Medicare Other | Admitting: Family Medicine

## 2011-11-28 ENCOUNTER — Encounter: Payer: Self-pay | Admitting: Family Medicine

## 2011-11-28 ENCOUNTER — Ambulatory Visit (INDEPENDENT_AMBULATORY_CARE_PROVIDER_SITE_OTHER): Payer: Medicare Other | Admitting: Family Medicine

## 2011-11-28 VITALS — BP 137/72 | HR 98 | Temp 98.0°F | Ht 65.5 in | Wt 135.1 lb

## 2011-11-28 DIAGNOSIS — E785 Hyperlipidemia, unspecified: Secondary | ICD-10-CM

## 2011-11-28 DIAGNOSIS — E782 Mixed hyperlipidemia: Secondary | ICD-10-CM

## 2011-11-28 DIAGNOSIS — I10 Essential (primary) hypertension: Secondary | ICD-10-CM

## 2011-11-28 DIAGNOSIS — D649 Anemia, unspecified: Secondary | ICD-10-CM

## 2011-11-28 HISTORY — DX: Hyperlipidemia, unspecified: E78.5

## 2011-11-28 HISTORY — DX: Anemia, unspecified: D64.9

## 2011-11-28 LAB — RENAL FUNCTION PANEL
Creatinine, Ser: 0.7 mg/dL (ref 0.4–1.2)
Glucose, Bld: 117 mg/dL — ABNORMAL HIGH (ref 70–99)
Phosphorus: 3.1 mg/dL (ref 2.3–4.6)
Potassium: 3.9 mEq/L (ref 3.5–5.1)
Sodium: 140 mEq/L (ref 135–145)

## 2011-11-28 LAB — CBC
HCT: 37.3 % (ref 36.0–46.0)
MCHC: 33.2 g/dL (ref 30.0–36.0)
MCV: 93 fl (ref 78.0–100.0)
RDW: 13.6 % (ref 11.5–14.6)

## 2011-11-28 NOTE — Progress Notes (Signed)
Patient ID: Lori Bright, female   DOB: 07/12/1924, 78 y.o.   MRN: [id number] Lori Bright [id number] 1924/06/28 11/28/2011      Progress Note-Follow Up  Subjective  Chief Complaint  Chief Complaint  Patient presents with  . Follow-up    HPI  Patient is an 61 we'll Caucasian female who continues to be in very good health. She is at the nursing home her husband 7 days a week from morning till night caring for him. She reports she is allowing herself to leave at 6 PM now and get a little bit of breath. She says she is taking care of her self except she's not exercising. She is eating well, sleeping okay. She denies any recent illness, fevers, chills, chest pain, palpitations, shortness of breath, GI or GU complaints at this time.  Past Medical History  Diagnosis Date  . Cervical cancer screening 04/03/2011  . Wrist pain, right 04/03/2011  . Anemia 11/28/2011  . Hyperlipidemia 11/28/2011    Past Surgical History  Procedure Date  . Tonsillectomy   . Hemorrhoid surgery   . Cataract extraction     b/l    Family History  Problem Relation Age of Onset  . Hip fracture Mother 98  . Heart disease Mother     w/ mitral valve replacement  . Obesity Mother   . Cancer Father     hepatic/ cigar smoker  . Cancer Brother     esophageal/ smoker  . Glaucoma Brother   . Other Daughter     early hysterectomy for menometroraghia  . Deep vein thrombosis Son   . Hip fracture Maternal Grandmother   . Other Maternal Grandmother     bladder prolapse  . Hip fracture Paternal Grandfather   . Glaucoma Paternal Grandfather   . Deep vein thrombosis Son     History   Social History  . Marital Status: Married    Spouse Name: N/A    Number of Children: N/A  . Years of Education: N/A   Occupational History  . Not on file.   Social History Main Topics  . Smoking status: Never Smoker   . Smokeless tobacco: Never Used  . Alcohol Use: Yes     rare  . Drug Use: No  . Sexually Active: Not on file    Other Topics Concern  . Not on file   Social History Narrative  . No narrative on file    Current Outpatient Prescriptions on File Prior to Visit  Medication Sig Dispense Refill  . atorvastatin (LIPITOR) 10 MG tablet TAKE 1 TABLET DAILY  90 tablet  0  . calcium carbonate (OS-CAL) 600 MG TABS Take 600 mg by mouth daily. Take 2       . DILT-XR 120 MG 24 hr capsule TAKE 1 CAPSULE DAILY  90 capsule  0  . DIOVAN 160 MG tablet TAKE 1 TABLET DAILY  90 tablet  0  . fish oil-omega-3 fatty acids 1000 MG capsule Take 2 g by mouth daily.        . hydrochlorothiazide (HYDRODIURIL) 25 MG tablet TAKE 1 TABLET DAILY  90 tablet  1  . Ferrous Fumarate-Folic Acid 324-1 MG TABS Take 1 tablet by mouth daily.  30 each  1    No Known Allergies  Review of Systems  Review of Systems  Constitutional: Negative for fever and malaise/fatigue.  HENT: Negative for congestion.   Eyes: Negative for discharge.  Respiratory: Negative for shortness of breath.   Cardiovascular: Negative  for chest pain, palpitations and leg swelling.  Gastrointestinal: Negative for nausea, abdominal pain and diarrhea.  Genitourinary: Negative for dysuria.  Musculoskeletal: Negative for falls.  Skin: Negative for rash.  Neurological: Negative for loss of consciousness and headaches.  Endo/Heme/Allergies: Negative for polydipsia.  Psychiatric/Behavioral: Negative for depression and suicidal ideas. The patient is not nervous/anxious and does not have insomnia.     Objective  BP 137/72  Pulse 98  Temp(Src) 98 F (36.7 C) (Temporal)  Ht 5' 5.5" (1.664 m)  Wt 135 lb 1.9 oz (61.29 kg)  BMI 22.14 kg/m2  SpO2 97%  Physical Exam  Physical Exam  Constitutional: She is oriented to person, place, and time and well-developed, well-nourished, and in no distress. No distress.  HENT:  Head: Normocephalic and atraumatic.  Eyes: Conjunctivae are normal.  Neck: Neck supple. No thyromegaly present.  Cardiovascular: Normal rate,  regular rhythm and normal heart sounds.   No murmur heard. Pulmonary/Chest: Effort normal and breath sounds normal. She has no wheezes.  Abdominal: She exhibits no distension and no mass.  Musculoskeletal: She exhibits no edema.  Lymphadenopathy:    She has no cervical adenopathy.  Neurological: She is alert and oriented to person, place, and time.  Skin: Skin is warm and dry. No rash noted. She is not diaphoretic.  Psychiatric: Memory, affect and judgment normal.    Lab Results  Component Value Date   TSH 3.749 03/30/2011   Lab Results  Component Value Date   WBC 6.2 11/28/2011   HGB 12.4 11/28/2011   HCT 37.3 11/28/2011   MCV 93.0 11/28/2011   PLT 200.0 11/28/2011   Lab Results  Component Value Date   CREATININE 0.7 11/28/2011   BUN 15 11/28/2011   NA 140 11/28/2011   K 3.9 11/28/2011   CL 102 11/28/2011   CO2 31 11/28/2011   Lab Results  Component Value Date   ALT 14 03/30/2011   AST 20 03/30/2011   ALKPHOS 63 03/30/2011   BILITOT 0.3 03/30/2011   Lab Results  Component Value Date   CHOL 137 05/03/2011   Lab Results  Component Value Date   HDL 49 05/03/2011   Lab Results  Component Value Date   LDLCALC 62 05/03/2011   Lab Results  Component Value Date   TRIG 128 05/03/2011   Lab Results  Component Value Date   CHOLHDL 2.8 05/03/2011     Assessment & Plan  Anemia Repeat cbc shows resolution.  MIXED HYPERLIPIDEMIA Tolerating atorvastatin  ESSENTIAL HYPERTENSION, BENIGN Adequately controlled on repeat.

## 2011-11-28 NOTE — Assessment & Plan Note (Signed)
Tolerating atorvastatin  ?

## 2011-11-28 NOTE — Patient Instructions (Signed)
Ringworm, Nail A fungal infection of the nail (tinea unguium/onychomycosis) is common. It is common as the visible part of the nail is composed of dead cells which have no blood supply to help prevent infection. It occurs because fungi are everywhere and will pick any opportunity to grow on any dead material. Because nails are very slow growing they require up to 2 years of treatment with anti-fungal medications. The entire nail back to the base is infected. This includes approximately ? of the nail which you cannot see. If your caregiver has prescribed a medication by mouth, take it every day and as directed. No progress will be seen for at least 6 to 9 months. Do not be disappointed! Because fungi live on dead cells with little or no exposure to blood supply, medication delivery to the infection is slow; thus the cure is slow. It is also why you can observe no progress in the first 6 months. The nail becoming cured is the base of the nail, as it has the blood supply. Topical medication such as creams and ointments are usually not effective. Important in successful treatment of nail fungus is closely following the medication regimen that your doctor prescribes. Sometimes you and your caregiver may elect to speed up this process by surgical removal of all the nails. Even this may still require 6 to 9 months of additional oral medications. See your caregiver as directed. Remember there will be no visible improvement for at least 6 months. See your caregiver sooner if other signs of infection (redness and swelling) develop. Document Released: 06/29/2000 Document Revised: 06/21/2011 Document Reviewed: 09/07/2008 [facility] Patient Information 2012 [place], [place]. Try soaking feet in distilled white vinegar and warm water for 15 minutes nightly and then apply Vick's vapor rub to nail bed

## 2011-11-28 NOTE — Assessment & Plan Note (Signed)
Repeat cbc shows resolution 

## 2011-11-28 NOTE — Assessment & Plan Note (Signed)
Adequately controlled on repeat.

## 2011-12-06 ENCOUNTER — Other Ambulatory Visit: Payer: Self-pay

## 2011-12-06 MED ORDER — DILTIAZEM HCL ER 120 MG PO CP24: 120.0000 mg | ORAL_CAPSULE | Freq: Every day | ORAL | Status: DC

## 2011-12-06 MED ORDER — VALSARTAN 160 MG PO TABS: 160.0000 mg | ORAL_TABLET | Freq: Every day | ORAL | Status: DC

## 2011-12-06 MED ORDER — ATORVASTATIN CALCIUM 10 MG PO TABS: 10.0000 mg | ORAL_TABLET | Freq: Every day | ORAL | Status: DC

## 2012-02-19 ENCOUNTER — Other Ambulatory Visit: Payer: Self-pay

## 2012-02-19 MED ORDER — HYDROCHLOROTHIAZIDE 25 MG PO TABS: 25.0000 mg | ORAL_TABLET | Freq: Every day | ORAL | Status: DC

## 2012-04-15 ENCOUNTER — Telehealth: Payer: Self-pay | Admitting: Family Medicine

## 2012-04-15 ENCOUNTER — Other Ambulatory Visit: Payer: Self-pay | Admitting: Family Medicine

## 2012-04-15 DIAGNOSIS — I10 Essential (primary) hypertension: Secondary | ICD-10-CM

## 2012-04-15 DIAGNOSIS — E785 Hyperlipidemia, unspecified: Secondary | ICD-10-CM

## 2012-04-15 NOTE — Telephone Encounter (Signed)
Let her know I ordered them for Monday

## 2012-04-15 NOTE — Telephone Encounter (Signed)
Caller: Veneda/Patient; Phone: [phone]; Reason for Call: Patient request to speak with Crystal regarding order to have lab work done.  States has appt with Dr.  Abner Greenspan on 10/14.  Please call her back.  Thanks

## 2012-04-15 NOTE — Telephone Encounter (Signed)
Pt states she needs MD to put her lab orders in the computer so she can get them drawn at [facility] lab next week. Please advise?

## 2012-04-21 ENCOUNTER — Other Ambulatory Visit (INDEPENDENT_AMBULATORY_CARE_PROVIDER_SITE_OTHER): Payer: Medicare Other

## 2012-04-21 DIAGNOSIS — I10 Essential (primary) hypertension: Secondary | ICD-10-CM

## 2012-04-21 DIAGNOSIS — E785 Hyperlipidemia, unspecified: Secondary | ICD-10-CM

## 2012-04-21 LAB — RENAL FUNCTION PANEL
CO2: 31 mEq/L (ref 19–32)
Calcium: 9.3 mg/dL (ref 8.4–10.5)
Chloride: 104 mEq/L (ref 96–112)
GFR: 100.64 mL/min (ref >60.00)
Sodium: 141 mEq/L (ref 135–145)

## 2012-04-21 LAB — TSH: TSH: 2.96 u[IU]/mL (ref 0.35–5.50)

## 2012-04-21 LAB — LIPID PANEL
LDL Cholesterol: 77 mg/dL (ref 0–99)
Total CHOL/HDL Ratio: 3
VLDL: 13 mg/dL (ref 0.0–40.0)

## 2012-04-21 LAB — HEPATIC FUNCTION PANEL
AST: 32 U/L (ref 0–37)
Alkaline Phosphatase: 52 U/L (ref 39–117)
Total Bilirubin: 0.5 mg/dL (ref 0.3–1.2)

## 2012-04-21 LAB — CBC
Hemoglobin: 12.3 g/dL (ref 12.0–15.0)
RBC: 3.98 Mil/uL (ref 3.87–5.11)
RDW: 13.8 % (ref 11.5–14.6)
WBC: 5.8 10*3/uL (ref 4.5–10.5)

## 2012-04-29 ENCOUNTER — Ambulatory Visit (INDEPENDENT_AMBULATORY_CARE_PROVIDER_SITE_OTHER): Payer: Medicare Other | Admitting: Family Medicine

## 2012-04-29 ENCOUNTER — Encounter: Payer: Self-pay | Admitting: Family Medicine

## 2012-04-29 VITALS — BP 130/75 | HR 78 | Temp 98.0°F | Ht 65.5 in | Wt 137.4 lb

## 2012-04-29 DIAGNOSIS — M818 Other osteoporosis without current pathological fracture: Secondary | ICD-10-CM

## 2012-04-29 DIAGNOSIS — Z8639 Personal history of other endocrine, nutritional and metabolic disease: Secondary | ICD-10-CM

## 2012-04-29 DIAGNOSIS — E782 Mixed hyperlipidemia: Secondary | ICD-10-CM

## 2012-04-29 DIAGNOSIS — D649 Anemia, unspecified: Secondary | ICD-10-CM

## 2012-04-29 DIAGNOSIS — Z23 Encounter for immunization: Secondary | ICD-10-CM

## 2012-04-29 DIAGNOSIS — Z862 Personal history of diseases of the blood and blood-forming organs and certain disorders involving the immune mechanism: Secondary | ICD-10-CM

## 2012-04-29 DIAGNOSIS — I10 Essential (primary) hypertension: Secondary | ICD-10-CM

## 2012-04-29 NOTE — Patient Instructions (Addendum)
Ask about Tdap (tetanus, whooping cough) vaccine

## 2012-05-01 NOTE — Assessment & Plan Note (Signed)
resolved 

## 2012-05-01 NOTE — Assessment & Plan Note (Signed)
Continue exercise regimen and oscal

## 2012-05-01 NOTE — Assessment & Plan Note (Signed)
Resolved on recent blood work no chnages in diet recommended

## 2012-05-01 NOTE — Assessment & Plan Note (Signed)
-   Well-controlled, continue current meds

## 2012-05-01 NOTE — Assessment & Plan Note (Signed)
Doing great with dietary changes and regular exercise. No changes today continue Atorvastatin

## 2012-05-01 NOTE — Progress Notes (Signed)
Patient ID: Lori Bright, female   DOB: [birth year], 78 y.o.   MRN: [id number] Lori Bright [id number] 04-29-1924 05/01/2012      Progress Note-Follow Up  Subjective  Chief Complaint  Chief Complaint  Patient presents with  . Follow-up    5 month    HPI  Patient is an 29 we'll Caucasian female who is in today for followup. She's doing very well. She tries to stay active and exercise regularly although she does get distracted by helping to care for her husband. He is in assisted living with [facility] to and she has been daily in hopes routinely with his care. She is encouraged to consider spending time at the holidays with her family and not bringing him along secondary to the advanced nature of his dementia. She says she's handling stress well. She denies any recent illness, chest pain, palpitations, headache, shortness of breath, GI or GU complaints at this time. She continues to maintain a heart healthy diet  Past Medical History  Diagnosis Date  . Cervical cancer screening 04/03/2011  . Wrist pain, right 04/03/2011  . Anemia 11/28/2011  . Hyperlipidemia 11/28/2011    Past Surgical History  Procedure Date  . Tonsillectomy   . Hemorrhoid surgery   . Cataract extraction     b/l    Family History  Problem Relation Age of Onset  . Hip fracture Mother 26  . Heart disease Mother     w/ mitral valve replacement  . Obesity Mother   . Cancer Father     hepatic/ cigar smoker  . Cancer Brother     esophageal/ smoker  . Glaucoma Brother   . Other Daughter     early hysterectomy for menometroraghia  . Deep vein thrombosis Son   . Hip fracture Maternal Grandmother   . Other Maternal Grandmother     bladder prolapse  . Hip fracture Paternal Grandfather   . Glaucoma Paternal Grandfather   . Deep vein thrombosis Son     History   Social History  . Marital Status: Married    Spouse Name: N/A    Number of Children: N/A  . Years of Education: N/A   Occupational History  . Not on  file.   Social History Main Topics  . Smoking status: Never Smoker   . Smokeless tobacco: Never Used  . Alcohol Use: Yes     rare  . Drug Use: No  . Sexually Active: Not on file   Other Topics Concern  . Not on file   Social History Narrative  . No narrative on file    Current Outpatient Prescriptions on File Prior to Visit  Medication Sig Dispense Refill  . atorvastatin (LIPITOR) 10 MG tablet Take 1 tablet (10 mg total) by mouth daily.  90 tablet  3  . diltiazem (DILT-XR) 120 MG 24 hr capsule Take 1 capsule (120 mg total) by mouth daily.  90 capsule  3  . Ferrous Fumarate-Folic Acid 324-1 MG TABS Take 1 tablet by mouth daily.  30 each  1  . fish oil-omega-3 fatty acids 1000 MG capsule Take 2 g by mouth daily.        . hydrochlorothiazide (HYDRODIURIL) 25 MG tablet Take 1 tablet (25 mg total) by mouth daily.  90 tablet  1  . valsartan (DIOVAN) 160 MG tablet Take 1 tablet (160 mg total) by mouth daily.  90 tablet  3    No Known Allergies  Review of Systems  Review of Systems  Constitutional: Negative for fever and malaise/fatigue.  HENT: Negative for congestion.   Eyes: Negative for discharge.  Respiratory: Negative for shortness of breath.   Cardiovascular: Negative for chest pain, palpitations and leg swelling.  Gastrointestinal: Negative for nausea, abdominal pain and diarrhea.  Genitourinary: Negative for dysuria.  Musculoskeletal: Negative for falls.  Skin: Negative for rash.  Neurological: Negative for loss of consciousness and headaches.  Endo/Heme/Allergies: Negative for polydipsia.  Psychiatric/Behavioral: Negative for depression and suicidal ideas. The patient is not nervous/anxious and does not have insomnia.     Objective  BP 130/75  Pulse 78  Temp 98 F (36.7 C) (Temporal)  Ht 5' 5.5" (1.664 m)  Wt 137 lb 6.4 oz (62.324 kg)  BMI 22.52 kg/m2  SpO2 97%  Physical Exam  Physical Exam  Constitutional: She is oriented to person, place, and time and  well-developed, well-nourished, and in no distress. No distress.  HENT:  Head: Normocephalic and atraumatic.  Eyes: Conjunctivae normal are normal.  Neck: Neck supple. No thyromegaly present.  Cardiovascular: Normal rate, regular rhythm and normal heart sounds.   No murmur heard. Pulmonary/Chest: Effort normal and breath sounds normal. She has no wheezes.  Abdominal: She exhibits no distension and no mass.  Musculoskeletal: She exhibits no edema.  Lymphadenopathy:    She has no cervical adenopathy.  Neurological: She is alert and oriented to person, place, and time.  Skin: Skin is warm and dry. No rash noted. She is not diaphoretic.  Psychiatric: Memory, affect and judgment normal.    Lab Results  Component Value Date   TSH 2.96 04/21/2012   Lab Results  Component Value Date   WBC 5.8 04/21/2012   HGB 12.3 04/21/2012   HCT 37.1 04/21/2012   MCV 93.4 04/21/2012   PLT 188.0 04/21/2012   Lab Results  Component Value Date   CREATININE 0.6 04/21/2012   BUN 16 04/21/2012   NA 141 04/21/2012   K 4.0 04/21/2012   CL 104 04/21/2012   CO2 31 04/21/2012   Lab Results  Component Value Date   ALT 30 04/21/2012   AST 32 04/21/2012   ALKPHOS 52 04/21/2012   BILITOT 0.5 04/21/2012   Lab Results  Component Value Date   CHOL 137 04/21/2012   Lab Results  Component Value Date   HDL 47.50 04/21/2012   Lab Results  Component Value Date   LDLCALC 77 04/21/2012   Lab Results  Component Value Date   TRIG 65.0 04/21/2012   Lab Results  Component Value Date   CHOLHDL 3 04/21/2012     Assessment & Plan  MIXED HYPERLIPIDEMIA Doing great with dietary changes and regular exercise. No changes today continue Atorvastatin  ESSENTIAL HYPERTENSION, BENIGN Well controlled, continue current meds  Anemia Resolved on recent blood work no chnages in diet recommended  HYPOKALEMIA, HX OF resolved  OTHER OSTEOPOROSIS Continue exercise regimen and oscal

## 2012-05-05 ENCOUNTER — Telehealth: Payer: Self-pay | Admitting: Family Medicine

## 2012-05-05 NOTE — Telephone Encounter (Signed)
Patient would like to know when he had his last tetanus shot.

## 2012-05-05 NOTE — Telephone Encounter (Signed)
I informed pt that I don't see any dates for her td. Pt states her son in law told her that Medicare won't pay for a TD? Pt is going to go and get this at the [facility] and have them send [place] the paperwork.

## 2012-09-06 ENCOUNTER — Other Ambulatory Visit: Payer: Self-pay | Admitting: Family Medicine

## 2012-10-17 ENCOUNTER — Encounter: Payer: Self-pay | Admitting: Family Medicine

## 2012-10-17 ENCOUNTER — Ambulatory Visit (INDEPENDENT_AMBULATORY_CARE_PROVIDER_SITE_OTHER): Payer: Medicare Other | Admitting: Family Medicine

## 2012-10-17 VITALS — BP 142/70 | HR 91 | Temp 99.3°F | Ht 65.5 in | Wt 139.1 lb

## 2012-10-17 DIAGNOSIS — I10 Essential (primary) hypertension: Secondary | ICD-10-CM

## 2012-10-17 DIAGNOSIS — E782 Mixed hyperlipidemia: Secondary | ICD-10-CM

## 2012-10-17 DIAGNOSIS — K1120 Sialoadenitis, unspecified: Secondary | ICD-10-CM

## 2012-10-17 MED ORDER — LOSARTAN POTASSIUM 50 MG PO TABS: 50.0000 mg | ORAL_TABLET | Freq: Every day | ORAL | Status: DC

## 2012-10-17 NOTE — Patient Instructions (Signed)

## 2012-10-18 ENCOUNTER — Encounter: Payer: Self-pay | Admitting: Family Medicine

## 2012-10-18 DIAGNOSIS — K1120 Sialoadenitis, unspecified: Secondary | ICD-10-CM

## 2012-10-18 HISTORY — DX: Sialoadenitis, unspecified: K11.20

## 2012-10-18 NOTE — Assessment & Plan Note (Signed)
Patient reports a recent episode which has now resolved. She will report returning symptoms

## 2012-10-18 NOTE — Assessment & Plan Note (Signed)
Insurance is forcing her to switch from Diovan to a generic. Will finish up her 2 months of Diovan she now has and then she will start Losartan 50 mg daily. minimize sodium

## 2012-10-18 NOTE — Assessment & Plan Note (Signed)
Tolerating Atorvastatin with good results. No changes

## 2012-10-18 NOTE — Progress Notes (Signed)
Patient ID: Lori Bright, female   DOB: Dec 23, 1923, 79 y.o.   MRN: [id number] Lori Bright [id number] Dec 11, 1923 10/18/2012      Progress Note-Follow Up  Subjective  Chief Complaint  Chief Complaint  Patient presents with  . Follow-up    6 month    HPI  Patient is an 79 year old female who is in today for followup. She continues to care daily for her husband who is in a nursing home with advanced dementia. She has agreed to take her vacation and so is following up early. She reports a recent episode of left-sided parotiditis which was evaluated by a family member who was a physician. This has resolved and she's having no more pain or swelling at this time. No other complaints. She continues to be well. No recent illness or fevers. No chest pain or palpitations. No shortness or breath, GI or GU concerns noted today.  Past Medical History  Diagnosis Date  . Cervical cancer screening 04/03/2011  . Wrist pain, right 04/03/2011  . Anemia 11/28/2011  . Hyperlipidemia 11/28/2011  . Parotiditis 10/18/2012    Past Surgical History  Procedure Laterality Date  . Tonsillectomy    . Hemorrhoid surgery    . Cataract extraction      b/l    Family History  Problem Relation Age of Onset  . Hip fracture Mother 46  . Heart disease Mother     w/ mitral valve replacement  . Obesity Mother   . Cancer Father     hepatic/ cigar smoker  . Cancer Brother     esophageal/ smoker  . Glaucoma Brother   . Other Daughter     early hysterectomy for menometroraghia  . Deep vein thrombosis Son   . Hip fracture Maternal Grandmother   . Other Maternal Grandmother     bladder prolapse  . Hip fracture Paternal Grandfather   . Glaucoma Paternal Grandfather   . Deep vein thrombosis Son     History   Social History  . Marital Status: Married    Spouse Name: N/A    Number of Children: N/A  . Years of Education: N/A   Occupational History  . Not on file.   Social History Main Topics  . Smoking status:  Never Smoker   . Smokeless tobacco: Never Used  . Alcohol Use: Yes     Comment: rare  . Drug Use: No  . Sexually Active: Not on file   Other Topics Concern  . Not on file   Social History Narrative  . No narrative on file    Current Outpatient Prescriptions on File Prior to Visit  Medication Sig Dispense Refill  . atorvastatin (LIPITOR) 10 MG tablet Take 1 tablet (10 mg total) by mouth daily.  90 tablet  3  . calcium carbonate (OS-CAL) 600 MG TABS Take 600 mg by mouth 2 (two) times daily with a meal.      . diltiazem (DILT-XR) 120 MG 24 hr capsule Take 1 capsule (120 mg total) by mouth daily.  90 capsule  3  . fish oil-omega-3 fatty acids 1000 MG capsule Take 2 g by mouth daily.        . hydrochlorothiazide (HYDRODIURIL) 25 MG tablet TAKE 1 TABLET DAILY  90 tablet  0   No current facility-administered medications on file prior to visit.    No Known Allergies  Review of Systems  Review of Systems  Constitutional: Negative for fever and malaise/fatigue.  HENT: Negative for congestion.  Eyes: Negative for discharge.  Respiratory: Negative for shortness of breath.   Cardiovascular: Negative for chest pain, palpitations and leg swelling.  Gastrointestinal: Negative for nausea, abdominal pain and diarrhea.  Genitourinary: Negative for dysuria.  Musculoskeletal: Negative for falls.  Skin: Negative for rash.  Neurological: Negative for loss of consciousness and headaches.  Endo/Heme/Allergies: Negative for polydipsia.  Psychiatric/Behavioral: Negative for depression and suicidal ideas. The patient is not nervous/anxious and does not have insomnia.     Objective  BP 142/70  Pulse 91  Temp(Src) 99.3 F (37.4 C) (Temporal)  Ht 5' 5.5" (1.664 m)  Wt 139 lb 1.9 oz (63.104 kg)  BMI 22.79 kg/m2  SpO2 98%  Physical Exam  Physical Exam  Constitutional: She is oriented to person, place, and time and well-developed, well-nourished, and in no distress. No distress.  HENT:   Head: Normocephalic and atraumatic.  Eyes: Conjunctivae are normal.  Neck: Neck supple. No thyromegaly present.  Cardiovascular: Normal rate, regular rhythm and normal heart sounds.   No murmur heard. Pulmonary/Chest: Effort normal and breath sounds normal. She has no wheezes.  Abdominal: She exhibits no distension and no mass.  Musculoskeletal: She exhibits no edema.  Lymphadenopathy:    She has no cervical adenopathy.  Neurological: She is alert and oriented to person, place, and time.  Skin: Skin is warm and dry. No rash noted. She is not diaphoretic.  Psychiatric: Memory, affect and judgment normal.    Lab Results  Component Value Date   TSH 2.96 04/21/2012   Lab Results  Component Value Date   WBC 5.8 04/21/2012   HGB 12.3 04/21/2012   HCT 37.1 04/21/2012   MCV 93.4 04/21/2012   PLT 188.0 04/21/2012   Lab Results  Component Value Date   CREATININE 0.6 04/21/2012   BUN 16 04/21/2012   NA 141 04/21/2012   K 4.0 04/21/2012   CL 104 04/21/2012   CO2 31 04/21/2012   Lab Results  Component Value Date   ALT 30 04/21/2012   AST 32 04/21/2012   ALKPHOS 52 04/21/2012   BILITOT 0.5 04/21/2012   Lab Results  Component Value Date   CHOL 137 04/21/2012   Lab Results  Component Value Date   HDL 47.50 04/21/2012   Lab Results  Component Value Date   LDLCALC 77 04/21/2012   Lab Results  Component Value Date   TRIG 65.0 04/21/2012   Lab Results  Component Value Date   CHOLHDL 3 04/21/2012     Assessment & Plan  ESSENTIAL HYPERTENSION, BENIGN Insurance is forcing her to switch from Diovan to a generic. Will finish up her 2 months of Diovan she now has and then she will start Losartan 50 mg daily. minimize sodium  Parotiditis Patient reports a recent episode which has now resolved. She will report returning symptoms  MIXED HYPERLIPIDEMIA Tolerating Atorvastatin with good results. No changes

## 2012-10-20 ENCOUNTER — Encounter: Payer: Self-pay | Admitting: Family Medicine

## 2012-10-28 ENCOUNTER — Ambulatory Visit: Payer: Medicare Other | Admitting: Family Medicine

## 2012-10-30 ENCOUNTER — Ambulatory Visit: Payer: Self-pay | Admitting: Family Medicine

## 2012-12-22 ENCOUNTER — Other Ambulatory Visit: Payer: Self-pay | Admitting: Family Medicine

## 2012-12-23 ENCOUNTER — Telehealth: Payer: Self-pay

## 2012-12-23 MED ORDER — ATORVASTATIN CALCIUM 10 MG PO TABS: 10.0000 mg | ORAL_TABLET | Freq: Every day | ORAL | Status: DC

## 2012-12-23 MED ORDER — DILTIAZEM HCL ER 120 MG PO CP24: 120.0000 mg | ORAL_CAPSULE | Freq: Every day | ORAL | Status: DC

## 2012-12-23 NOTE — Telephone Encounter (Signed)
Pt left a vm stating that she needs 2 medications called into [facility].  I called pt back and she stated that she is not at home so she will call back with the names of these medications

## 2013-01-20 ENCOUNTER — Ambulatory Visit (INDEPENDENT_AMBULATORY_CARE_PROVIDER_SITE_OTHER): Payer: 59 | Admitting: Family Medicine

## 2013-01-20 ENCOUNTER — Other Ambulatory Visit: Payer: Self-pay | Admitting: Family Medicine

## 2013-01-20 ENCOUNTER — Encounter: Payer: Self-pay | Admitting: Family Medicine

## 2013-01-20 VITALS — BP 150/70 | HR 96 | Temp 98.5°F | Ht 65.5 in | Wt 140.0 lb

## 2013-01-20 DIAGNOSIS — D649 Anemia, unspecified: Secondary | ICD-10-CM

## 2013-01-20 DIAGNOSIS — I10 Essential (primary) hypertension: Secondary | ICD-10-CM

## 2013-01-20 DIAGNOSIS — E782 Mixed hyperlipidemia: Secondary | ICD-10-CM

## 2013-01-20 DIAGNOSIS — Z8639 Personal history of other endocrine, nutritional and metabolic disease: Secondary | ICD-10-CM

## 2013-01-20 DIAGNOSIS — M818 Other osteoporosis without current pathological fracture: Secondary | ICD-10-CM

## 2013-01-20 LAB — RENAL FUNCTION PANEL
Albumin: 4.6 g/dL (ref 3.5–5.2)
Calcium: 10.2 mg/dL (ref 8.4–10.5)
Glucose, Bld: 103 mg/dL — ABNORMAL HIGH (ref 70–99)
Phosphorus: 3.8 mg/dL (ref 2.3–4.6)
Potassium: 3.9 mEq/L (ref 3.5–5.3)
Sodium: 142 mEq/L (ref 135–145)

## 2013-01-20 LAB — TSH: TSH: 5.042 u[IU]/mL — ABNORMAL HIGH (ref 0.350–4.500)

## 2013-01-20 MED ORDER — LOSARTAN POTASSIUM 100 MG PO TABS: 100.0000 mg | ORAL_TABLET | Freq: Every day | ORAL | Status: DC

## 2013-01-20 NOTE — Patient Instructions (Addendum)
Next visit annual  With labs prior renal, lipid, hepatic, tsh, mag, cbc, vitamin d  Hypertension As your heart beats, it forces blood through your arteries. This force is your blood pressure. If the pressure is too high, it is called hypertension (HTN) or high blood pressure. HTN is dangerous because you may have it and not know it. High blood pressure may mean that your heart has to work harder to pump blood. Your arteries may be narrow or stiff. The extra work puts you at risk for heart disease, stroke, and other problems.  Blood pressure consists of two numbers, a higher number over a lower, 110/72, for example. It is stated as "110 over 72." The ideal is below 120 for the top number (systolic) and under 80 for the bottom (diastolic). Write down your blood pressure today. You should pay close attention to your blood pressure if you have certain conditions such as:  Heart failure.  Prior heart attack.  Diabetes  Chronic kidney disease.  Prior stroke.  Multiple risk factors for heart disease. To see if you have HTN, your blood pressure should be measured while you are seated with your arm held at the level of the heart. It should be measured at least twice. A one-time elevated blood pressure reading (especially in the Emergency Department) does not mean that you need treatment. There may be conditions in which the blood pressure is different between your right and left arms. It is important to see your caregiver soon for a recheck. Most people have essential hypertension which means that there is not a specific cause. This type of high blood pressure may be lowered by changing lifestyle factors such as:  Stress.  Smoking.  Lack of exercise.  Excessive weight.  Drug/tobacco/alcohol use.  Eating less salt. Most people do not have symptoms from high blood pressure until it has caused damage to the body. Effective treatment can often prevent, delay or reduce that damage. TREATMENT   When a cause has been identified, treatment for high blood pressure is directed at the cause. There are a large number of medications to treat HTN. These fall into several categories, and your caregiver will help you select the medicines that are best for you. Medications may have side effects. You should review side effects with your caregiver. If your blood pressure stays high after you have made lifestyle changes or started on medicines,   Your medication(s) may need to be changed.  Other problems may need to be addressed.  Be certain you understand your prescriptions, and know how and when to take your medicine.  Be sure to follow up with your caregiver within the time frame advised (usually within two weeks) to have your blood pressure rechecked and to review your medications.  If you are taking more than one medicine to lower your blood pressure, make sure you know how and at what times they should be taken. Taking two medicines at the same time can result in blood pressure that is too low. SEEK IMMEDIATE MEDICAL CARE IF:  You develop a severe headache, blurred or changing vision, or confusion.  You have unusual weakness or numbness, or a faint feeling.  You have severe chest or abdominal pain, vomiting, or breathing problems. MAKE SURE YOU:   Understand these instructions.  Will watch your condition.  Will get help right away if you are not doing well or get worse. Document Released: 07/02/2005 Document Revised: 09/24/2011 Document Reviewed: 02/20/2008 [facility] Patient Information 2014 [place], [place].

## 2013-01-20 NOTE — Assessment & Plan Note (Signed)
Resolved with last blood work will check again prior to next visit.
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
## 2013-01-20 NOTE — Assessment & Plan Note (Signed)
Stays very active with  [employer]

## 2013-01-20 NOTE — Assessment & Plan Note (Signed)
Not present on today's blood work

## 2013-01-20 NOTE — Assessment & Plan Note (Signed)
Well controlled no changes 

## 2013-01-20 NOTE — Assessment & Plan Note (Signed)
Tolerating Lipitor recheck panel prior to next visit. Avoid trans fats

## 2013-01-21 LAB — T4, FREE: Free T4: 1.2 ng/dL (ref 0.80–1.80)

## 2013-01-27 NOTE — Progress Notes (Signed)
Quick Note:  Patient Informed and voiced understanding ______ 

## 2013-03-09 ENCOUNTER — Other Ambulatory Visit: Payer: Self-pay

## 2013-03-09 DIAGNOSIS — I10 Essential (primary) hypertension: Secondary | ICD-10-CM

## 2013-03-09 MED ORDER — HYDROCHLOROTHIAZIDE 25 MG PO TABS: 25.0000 mg | ORAL_TABLET | Freq: Every day | ORAL | Status: DC

## 2013-03-09 MED ORDER — LOSARTAN POTASSIUM 100 MG PO TABS: 100.0000 mg | ORAL_TABLET | Freq: Every day | ORAL | Status: DC

## 2013-05-18 ENCOUNTER — Other Ambulatory Visit (INDEPENDENT_AMBULATORY_CARE_PROVIDER_SITE_OTHER): Payer: Medicare Other

## 2013-05-18 ENCOUNTER — Telehealth: Payer: Self-pay | Admitting: *Deleted

## 2013-05-18 DIAGNOSIS — Z0000 Encounter for general adult medical examination without abnormal findings: Secondary | ICD-10-CM

## 2013-05-18 DIAGNOSIS — I10 Essential (primary) hypertension: Secondary | ICD-10-CM

## 2013-05-18 DIAGNOSIS — E782 Mixed hyperlipidemia: Secondary | ICD-10-CM

## 2013-05-18 LAB — CBC
HCT: 38 % (ref 36.0–46.0)
Hemoglobin: 12.8 g/dL (ref 12.0–15.0)
Platelets: 223 10*3/uL (ref 150.0–400.0)
RBC: 4.2 Mil/uL (ref 3.87–5.11)
WBC: 5.9 10*3/uL (ref 4.5–10.5)

## 2013-05-18 LAB — RENAL FUNCTION PANEL
Albumin: 3.9 g/dL (ref 3.5–5.2)
CO2: 32 mEq/L (ref 19–32)
Calcium: 9.5 mg/dL (ref 8.4–10.5)
Chloride: 106 mEq/L (ref 96–112)
Creatinine, Ser: 0.8 mg/dL (ref 0.4–1.2)
Sodium: 143 mEq/L (ref 135–145)

## 2013-05-18 LAB — LIPID PANEL
Cholesterol: 136 mg/dL (ref 0–200)
HDL: 48.9 mg/dL (ref >39.00)
Total CHOL/HDL Ratio: 3
Triglycerides: 85 mg/dL (ref 0.0–149.0)

## 2013-05-18 LAB — MAGNESIUM: Magnesium: 2.1 mg/dL (ref 1.5–2.5)

## 2013-05-18 LAB — TSH: TSH: 2.8 u[IU]/mL (ref 0.35–5.50)

## 2013-05-18 LAB — HEPATIC FUNCTION PANEL
ALT: 16 U/L (ref 0–35)
AST: 21 U/L (ref 0–37)
Albumin: 3.9 g/dL (ref 3.5–5.2)
Alkaline Phosphatase: 51 U/L (ref 39–117)
Total Bilirubin: 0.6 mg/dL (ref 0.3–1.2)

## 2013-05-18 NOTE — Telephone Encounter (Signed)
Pt presented to the lab. Orders entered per 01/20/13 office note as below:  Next visit annual  With labs prior renal, lipid, hepatic, tsh, mag, cbc, vitamin d

## 2013-05-19 ENCOUNTER — Encounter: Payer: Self-pay | Admitting: Family Medicine

## 2013-05-19 ENCOUNTER — Ambulatory Visit (INDEPENDENT_AMBULATORY_CARE_PROVIDER_SITE_OTHER): Payer: Medicare Other | Admitting: Family Medicine

## 2013-05-19 VITALS — BP 178/68 | HR 93 | Temp 98.4°F | Ht 65.5 in | Wt 139.0 lb

## 2013-05-19 DIAGNOSIS — M818 Other osteoporosis without current pathological fracture: Secondary | ICD-10-CM

## 2013-05-19 DIAGNOSIS — E782 Mixed hyperlipidemia: Secondary | ICD-10-CM

## 2013-05-19 DIAGNOSIS — I10 Essential (primary) hypertension: Secondary | ICD-10-CM

## 2013-05-19 DIAGNOSIS — Z0000 Encounter for general adult medical examination without abnormal findings: Secondary | ICD-10-CM

## 2013-05-19 DIAGNOSIS — L578 Other skin changes due to chronic exposure to nonionizing radiation: Secondary | ICD-10-CM

## 2013-05-19 DIAGNOSIS — E559 Vitamin D deficiency, unspecified: Secondary | ICD-10-CM

## 2013-05-19 DIAGNOSIS — Z23 Encounter for immunization: Secondary | ICD-10-CM

## 2013-05-19 DIAGNOSIS — Z4689 Encounter for fitting and adjustment of other specified devices: Secondary | ICD-10-CM

## 2013-05-19 LAB — VITAMIN D 25 HYDROXY (VIT D DEFICIENCY, FRACTURES): Vit D, 25-Hydroxy: 37 ng/mL (ref 30–89)

## 2013-05-19 MED ORDER — VALSARTAN 160 MG PO TABS: 160.0000 mg | ORAL_TABLET | Freq: Every day | ORAL | Status: DC

## 2013-05-19 NOTE — Patient Instructions (Signed)
DASH Diet  The DASH diet stands for "Dietary Approaches to Stop Hypertension." It is a healthy eating plan that has been shown to reduce high blood pressure (hypertension) in as little as 14 days, while also possibly providing other significant health benefits. These other health benefits include reducing the risk of breast cancer after menopause and reducing the risk of type 2 diabetes, heart disease, colon cancer, and stroke. Health benefits also include weight loss and slowing kidney failure in patients with chronic kidney disease.   DIET GUIDELINES  · Limit salt (sodium). Your diet should contain less than 1500 mg of sodium daily.  · Limit refined or processed carbohydrates. Your diet should include mostly whole grains. Desserts and added sugars should be used sparingly.  · Include small amounts of heart-healthy fats. These types of fats include nuts, oils, and tub margarine. Limit saturated and trans fats. These fats have been shown to be harmful in the body.  CHOOSING FOODS   The following food groups are based on a 2000 calorie diet. See your Registered Dietitian for individual calorie needs.  Grains and Grain Products (6 to 8 servings daily)  · Eat More Often: Whole-wheat bread, brown rice, whole-grain or wheat pasta, quinoa, popcorn without added fat or salt (air popped).  · Eat Less Often: White bread, white pasta, white rice, cornbread.  Vegetables (4 to 5 servings daily)  · Eat More Often: Fresh, frozen, and canned vegetables. Vegetables may be raw, steamed, roasted, or grilled with a minimal amount of fat.  · Eat Less Often/Avoid: Creamed or fried vegetables. Vegetables in a cheese sauce.  Fruit (4 to 5 servings daily)  · Eat More Often: All fresh, canned (in natural juice), or frozen fruits. Dried fruits without added sugar. One hundred percent fruit juice (½ cup [237 mL] daily).  · Eat Less Often: Dried fruits with added sugar. Canned fruit in light or heavy syrup.  Lean Meats, Fish, and Poultry (2  servings or less daily. One serving is 3 to 4 oz [85-114 g]).  · Eat More Often: Ninety percent or leaner ground beef, tenderloin, sirloin. Round cuts of beef, chicken breast, turkey breast. All fish. Grill, bake, or broil your meat. Nothing should be fried.  · Eat Less Often/Avoid: Fatty cuts of meat, turkey, or chicken leg, thigh, or wing. Fried cuts of meat or fish.  Dairy (2 to 3 servings)  · Eat More Often: Low-fat or fat-free milk, low-fat plain or light yogurt, reduced-fat or part-skim cheese.  · Eat Less Often/Avoid: Milk (whole, 2%). Whole milk yogurt. Full-fat cheeses.  Nuts, Seeds, and Legumes (4 to 5 servings per week)  · Eat More Often: All without added salt.  · Eat Less Often/Avoid: Salted nuts and seeds, canned beans with added salt.  Fats and Sweets (limited)  · Eat More Often: Vegetable oils, tub margarines without trans fats, sugar-free gelatin. Mayonnaise and salad dressings.  · Eat Less Often/Avoid: Coconut oils, palm oils, butter, stick margarine, cream, half and half, cookies, candy, pie.  FOR MORE INFORMATION  The Dash Diet Eating Plan: www.dashdiet.org  Document Released: 06/21/2011 Document Revised: 09/24/2011 Document Reviewed: 06/21/2011  [employer]® Patient Information ©2014 [employer].

## 2013-05-24 ENCOUNTER — Encounter: Payer: Self-pay | Admitting: Family Medicine

## 2013-05-24 DIAGNOSIS — Z0000 Encounter for general adult medical examination without abnormal findings: Secondary | ICD-10-CM

## 2013-05-24 HISTORY — DX: Encounter for general adult medical examination without abnormal findings: Z00.00

## 2013-05-24 NOTE — Assessment & Plan Note (Signed)
Losartan ineffective, will switch back to Diovan 160 and reassess at next visit. Encouraged DASH diet.
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
## 2013-05-24 NOTE — Assessment & Plan Note (Signed)
Well treated 

## 2013-05-24 NOTE — Assessment & Plan Note (Signed)
Stays very active with Yoga and Taichi, encourage dcalcium with vitamin d bid.

## 2013-05-24 NOTE — Assessment & Plan Note (Signed)
Tolerating Atorvasatin, avoid trans fats. Good control

## 2013-05-26 ENCOUNTER — Ambulatory Visit: Payer: 59

## 2013-05-28 ENCOUNTER — Ambulatory Visit (INDEPENDENT_AMBULATORY_CARE_PROVIDER_SITE_OTHER): Payer: Medicare Other | Admitting: *Deleted

## 2013-05-28 ENCOUNTER — Ambulatory Visit: Payer: 59

## 2013-05-28 DIAGNOSIS — Z23 Encounter for immunization: Secondary | ICD-10-CM

## 2013-05-28 NOTE — Progress Notes (Signed)
  Subjective:    Patient ID: Lori Bright, female    DOB: December 18, 1923, 79 y.o.   MRN: [id number]  HPI    Review of Systems     Objective:   Physical Exam        Assessment & Plan:  Pre visit review using our clinic review tool, if applicable. No additional management support is needed unless otherwise documented below in the visit note/SLS After obtaining consent, and per orders of Dr. Abner Greenspan, injection of Tdap given by [facility]. Patient tolerated well & given VIS/SLS

## 2013-06-04 ENCOUNTER — Ambulatory Visit: Payer: 59

## 2013-06-25 ENCOUNTER — Other Ambulatory Visit: Payer: Self-pay | Admitting: Family Medicine

## 2013-07-21 ENCOUNTER — Encounter: Payer: Self-pay | Admitting: Family Medicine

## 2013-07-21 ENCOUNTER — Ambulatory Visit (INDEPENDENT_AMBULATORY_CARE_PROVIDER_SITE_OTHER): Payer: 59 | Admitting: Family Medicine

## 2013-07-21 ENCOUNTER — Telehealth: Payer: Self-pay | Admitting: Family Medicine

## 2013-07-21 VITALS — BP 142/72 | HR 94 | Temp 98.3°F | Ht 65.5 in | Wt 143.0 lb

## 2013-07-21 DIAGNOSIS — Z8639 Personal history of other endocrine, nutritional and metabolic disease: Secondary | ICD-10-CM

## 2013-07-21 DIAGNOSIS — D649 Anemia, unspecified: Secondary | ICD-10-CM

## 2013-07-21 DIAGNOSIS — Z862 Personal history of diseases of the blood and blood-forming organs and certain disorders involving the immune mechanism: Secondary | ICD-10-CM

## 2013-07-21 DIAGNOSIS — E782 Mixed hyperlipidemia: Secondary | ICD-10-CM

## 2013-07-21 DIAGNOSIS — Z0000 Encounter for general adult medical examination without abnormal findings: Secondary | ICD-10-CM

## 2013-07-21 DIAGNOSIS — I10 Essential (primary) hypertension: Secondary | ICD-10-CM

## 2013-07-21 DIAGNOSIS — E876 Hypokalemia: Secondary | ICD-10-CM

## 2013-07-21 NOTE — Telephone Encounter (Signed)
Lab order week of 11-09-2013 Labs prior lipid, renal, cbc, tsh, hepatic

## 2013-07-21 NOTE — Telephone Encounter (Signed)
Lab order placed.

## 2013-07-21 NOTE — Patient Instructions (Addendum)
Try ice and aspercreme twice daily  Consider Gatorade 1 hour prior to exercise if you keep feeling light headed, let us know if no improvement Arthritis, Nonspecific Arthritis is inflammation of a joint. This usually means pain, redness, warmth or swelling are present. One or more joints may be involved. There are a number of types of arthritis. Your caregiver may not be able to tell what type of arthritis you have right away. CAUSES  The most common cause of arthritis is the wear and tear on the joint (osteoarthritis). This causes damage to the cartilage, which can break down over time. The knees, hips, back and neck are most often affected by this type of arthritis. Other types of arthritis and common causes of joint pain include:  Sprains and other injuries near the joint. Sometimes minor sprains and injuries cause pain and swelling that develop hours later.  Rheumatoid arthritis. This affects hands, feet and knees. It usually affects both sides of your body at the same time. It is often associated with chronic ailments, fever, weight loss and general weakness.  Crystal arthritis. Gout and pseudo gout can cause occasional acute severe pain, redness and swelling in the foot, ankle, or knee.  Infectious arthritis. Bacteria can get into a joint through a break in overlying skin. This can cause infection of the joint. Bacteria and viruses can also spread through the blood and affect your joints.  Drug, infectious and allergy reactions. Sometimes joints can become mildly painful and slightly swollen with these types of illnesses. SYMPTOMS   Pain is the main symptom.  Your joint or joints can also be red, swollen and warm or hot to the touch.  You may have a fever with certain types of arthritis, or even feel overall ill.  The joint with arthritis will hurt with movement. Stiffness is present with some types of arthritis. DIAGNOSIS  Your caregiver will suspect arthritis based on your  description of your symptoms and on your exam. Testing may be needed to find the type of arthritis:  Blood and sometimes urine tests.  X-ray tests and sometimes CT or MRI scans.  Removal of fluid from the joint (arthrocentesis) is done to check for bacteria, crystals or other causes. Your caregiver (or a specialist) will numb the area over the joint with a local anesthetic, and use a needle to remove joint fluid for examination. This procedure is only minimally uncomfortable.  Even with these tests, your caregiver may not be able to tell what kind of arthritis you have. Consultation with a specialist (rheumatologist) may be helpful. TREATMENT  Your caregiver will discuss with you treatment specific to your type of arthritis. If the specific type cannot be determined, then the following general recommendations may apply. Treatment of severe joint pain includes:  Rest.  Elevation.  Anti-inflammatory medication (for example, ibuprofen) may be prescribed. Avoiding activities that cause increased pain.  Only take over-the-counter or prescription medicines for pain and discomfort as recommended by your caregiver.  Cold packs over an inflamed joint may be used for 10 to 15 minutes every hour. Hot packs sometimes feel better, but do not use overnight. Do not use hot packs if you are diabetic without your caregiver's permission.  A cortisone shot into arthritic joints may help reduce pain and swelling.  Any acute arthritis that gets worse over the next 1 to 2 days needs to be looked at to be sure there is no joint infection. Long-term arthritis treatment involves modifying activities and lifestyle to  reduce joint stress jarring. This can include weight loss. Also, exercise is needed to nourish the joint cartilage and remove waste. This helps keep the muscles around the joint strong. HOME CARE INSTRUCTIONS   Do not take aspirin to relieve pain if gout is suspected. This elevates uric acid  levels.  Only take over-the-counter or prescription medicines for pain, discomfort or fever as directed by your caregiver.  Rest the joint as much as possible.  If your joint is swollen, keep it elevated.  Use crutches if the painful joint is in your leg.  Drinking plenty of fluids may help for certain types of arthritis.  Follow your caregiver's dietary instructions.  Try low-impact exercise such as:  Swimming.  Water aerobics.  Biking.  Walking.  Morning stiffness is often relieved by a warm shower.  Put your joints through regular range-of-motion. SEEK MEDICAL CARE IF:   You do not feel better in 24 hours or are getting worse.  You have side effects to medications, or are not getting better with treatment. SEEK IMMEDIATE MEDICAL CARE IF:   You have a fever.  You develop severe joint pain, swelling or redness.  Many joints are involved and become painful and swollen.  There is severe back pain and/or leg weakness.  You have loss of bowel or bladder control. Document Released: 08/09/2004 Document Revised: 09/24/2011 Document Reviewed: 08/25/2008 [facility] Patient Information 2014 [place].

## 2013-07-21 NOTE — Progress Notes (Signed)
Pre visit review using our clinic review tool, if applicable. No additional management support is needed unless otherwise documented below in the visit note. 

## 2013-07-22 ENCOUNTER — Encounter: Payer: Self-pay | Admitting: Family Medicine

## 2013-07-22 ENCOUNTER — Telehealth: Payer: Self-pay | Admitting: Family Medicine

## 2013-07-22 LAB — RENAL FUNCTION PANEL
Albumin: 4.4 g/dL (ref 3.5–5.2)
BUN: 20 mg/dL (ref 6–23)
CALCIUM: 9.8 mg/dL (ref 8.4–10.5)
CO2: 29 mEq/L (ref 19–32)
Chloride: 103 mEq/L (ref 96–112)
Creat: 0.85 mg/dL (ref 0.50–1.10)
Glucose, Bld: 96 mg/dL (ref 70–99)
PHOSPHORUS: 3.6 mg/dL (ref 2.3–4.6)
POTASSIUM: 4.4 meq/L (ref 3.5–5.3)
SODIUM: 141 meq/L (ref 135–145)

## 2013-07-22 NOTE — Assessment & Plan Note (Signed)
Improved on recheck encouraged DASH diet and continue current meds and regular exercise.

## 2013-07-22 NOTE — Assessment & Plan Note (Signed)
resolved 

## 2013-07-22 NOTE — Progress Notes (Signed)
Patient ID: Lori Bright, female   DOB: 1924-04-19, 80 y.o.   MRN: [id number] Calianna Kim [id number] June 19, 1924 07/22/2013      Progress Note-Follow Up  Subjective  Chief Complaint  Chief Complaint  Patient presents with  . Follow-up    2 month    HPI  Patient is a 80 year old female who is in today for followup. She continues to exercise regularly and eat a heart healthy diet. She has had some recent discomfort in her right hand but offers no other acute complaints. No fevers or chills. No headache, chest pain or palpitations. No shortness of breath GI GU concerns. Taking medications as prescribed.  Past Medical History  Diagnosis Date  . Cervical cancer screening 04/03/2011  . Wrist pain, right 04/03/2011  . Anemia 11/28/2011  . Hyperlipidemia 11/28/2011  . Parotiditis 10/18/2012  . Annual physical exam 05/24/2013    Past Surgical History  Procedure Laterality Date  . Tonsillectomy    . Hemorrhoid surgery    . Cataract extraction      b/l    Family History  Problem Relation Age of Onset  . Hip fracture Mother 45  . Heart disease Mother     w/ mitral valve replacement  . Obesity Mother   . Cancer Father     hepatic/ cigar smoker  . Cancer Brother     esophageal/ smoker  . Glaucoma Brother   . Other Daughter     early hysterectomy for menometroraghia  . Deep vein thrombosis Son   . Hip fracture Maternal Grandmother   . Other Maternal Grandmother     bladder prolapse  . Hip fracture Paternal Grandfather   . Glaucoma Paternal Grandfather   . Deep vein thrombosis Son     History   Social History  . Marital Status: Married    Spouse Name: N/A    Number of Children: N/A  . Years of Education: N/A   Occupational History  . Not on file.   Social History Main Topics  . Smoking status: Never Smoker   . Smokeless tobacco: Never Used  . Alcohol Use: Yes     Comment: rare  . Drug Use: No  . Sexual Activity: Not on file   Other Topics Concern  . Not on file    Social History Narrative  . No narrative on file    Current Outpatient Prescriptions on File Prior to Visit  Medication Sig Dispense Refill  . atorvastatin (LIPITOR) 10 MG tablet TAKE 1 TABLET DAILY  90 tablet  0  . calcium carbonate (OS-CAL) 600 MG TABS Take 600 mg by mouth 2 (two) times daily with a meal.      . DILT-XR 120 MG 24 hr capsule TAKE 1 CAPSULE DAILY  90 capsule  0  . fish oil-omega-3 fatty acids 1000 MG capsule Take 2 g by mouth daily.        . hydrochlorothiazide (HYDRODIURIL) 25 MG tablet Take 1 tablet (25 mg total) by mouth daily.  90 tablet  2  . valsartan (DIOVAN) 160 MG tablet Take 1 tablet (160 mg total) by mouth daily.  90 tablet  1   No current facility-administered medications on file prior to visit.    No Known Allergies  Review of Systems  Review of Systems  Constitutional: Negative for fever and malaise/fatigue.  HENT: Negative for congestion.   Eyes: Negative for discharge.  Respiratory: Negative for shortness of breath.   Cardiovascular: Negative for chest pain, palpitations and leg swelling.  Gastrointestinal: Negative for nausea, abdominal pain and diarrhea.  Genitourinary: Negative for dysuria.  Musculoskeletal: Negative for falls.  Skin: Negative for rash.  Neurological: Negative for loss of consciousness and headaches.  Endo/Heme/Allergies: Negative for polydipsia.  Psychiatric/Behavioral: Negative for depression and suicidal ideas. The patient is not nervous/anxious and does not have insomnia.     Objective  BP 142/72  Pulse 94  Temp(Src) 98.3 F (36.8 C) (Oral)  Ht 5' 5.5" (1.664 m)  Wt 143 lb (64.864 kg)  BMI 23.43 kg/m2  SpO2 96%  Physical Exam  Physical Exam  Constitutional: She is oriented to person, place, and time and well-developed, well-nourished, and in no distress. No distress.  HENT:  Head: Normocephalic and atraumatic.  Right Ear: External ear normal.  Left Ear: External ear normal.  Nose: Nose normal.   Mouth/Throat: Oropharynx is clear and moist. No oropharyngeal exudate.  Eyes: Conjunctivae are normal. Pupils are equal, round, and reactive to light. Right eye exhibits no discharge. Left eye exhibits no discharge. No scleral icterus.  Neck: Normal range of motion. Neck supple. No thyromegaly present.  Cardiovascular: Normal rate, regular rhythm, normal heart sounds and intact distal pulses.   No murmur heard. Pulmonary/Chest: Effort normal and breath sounds normal. No respiratory distress. She has no wheezes. She has no rales.  Abdominal: Soft. Bowel sounds are normal. She exhibits no distension and no mass. There is no tenderness.  Musculoskeletal: Normal range of motion. She exhibits no edema and no tenderness.  Lymphadenopathy:    She has no cervical adenopathy.  Neurological: She is alert and oriented to person, place, and time. She has normal reflexes. No cranial nerve deficit. Coordination normal.  Skin: Skin is warm and dry. No rash noted. She is not diaphoretic.  Psychiatric: Mood, memory and affect normal.      Lab Results  Component Value Date   TSH 2.80 05/18/2013   Lab Results  Component Value Date   WBC 5.9 05/18/2013   HGB 12.8 05/18/2013   HCT 38.0 05/18/2013   MCV 90.5 05/18/2013   PLT 223.0 05/18/2013   Lab Results  Component Value Date   CREATININE 0.85 07/21/2013   BUN 20 07/21/2013   NA 141 07/21/2013   K 4.4 07/21/2013   CL 103 07/21/2013   CO2 29 07/21/2013   Lab Results  Component Value Date   ALT 16 05/18/2013   AST 21 05/18/2013   ALKPHOS 51 05/18/2013   BILITOT 0.6 05/18/2013   Lab Results  Component Value Date   CHOL 136 05/18/2013   Lab Results  Component Value Date   HDL 48.90 05/18/2013   Lab Results  Component Value Date   LDLCALC 70 05/18/2013   Lab Results  Component Value Date   TRIG 85.0 05/18/2013   Lab Results  Component Value Date   CHOLHDL 3 05/18/2013     Assessment & Plan  ESSENTIAL HYPERTENSION, BENIGN Improved on recheck  encouraged DASH diet and continue current meds and regular exercise.   MIXED HYPERLIPIDEMIA Tolerating Atorvastatin, no changes.   HYPOKALEMIA, HX OF resolved  Anemia resolved

## 2013-07-22 NOTE — Assessment & Plan Note (Signed)
Tolerating Atorvastatin, no changes.

## 2013-07-22 NOTE — Telephone Encounter (Signed)
Relevant patient education assigned to patient using Emmi. ° °

## 2013-08-14 ENCOUNTER — Telehealth: Payer: Self-pay | Admitting: Family Medicine

## 2013-08-19 NOTE — Telephone Encounter (Signed)
Relevant patient education mailed to patient.  

## 2013-09-23 ENCOUNTER — Other Ambulatory Visit: Payer: Self-pay | Admitting: Family Medicine

## 2013-11-04 ENCOUNTER — Telehealth: Payer: Self-pay | Admitting: *Deleted

## 2013-11-04 DIAGNOSIS — I10 Essential (primary) hypertension: Secondary | ICD-10-CM

## 2013-11-04 MED ORDER — VALSARTAN 160 MG PO TABS: 160.0000 mg | ORAL_TABLET | Freq: Every day | ORAL | Status: DC

## 2013-11-04 NOTE — Telephone Encounter (Signed)
Pt left message requesting refill of valsartan.  Spoke with pt and verified to send rx to [facility]. Refill sent, pt aware.

## 2013-11-10 ENCOUNTER — Other Ambulatory Visit (INDEPENDENT_AMBULATORY_CARE_PROVIDER_SITE_OTHER): Payer: 59

## 2013-11-10 ENCOUNTER — Telehealth: Payer: Self-pay

## 2013-11-10 DIAGNOSIS — I10 Essential (primary) hypertension: Secondary | ICD-10-CM

## 2013-11-10 DIAGNOSIS — D649 Anemia, unspecified: Secondary | ICD-10-CM

## 2013-11-10 DIAGNOSIS — E782 Mixed hyperlipidemia: Secondary | ICD-10-CM

## 2013-11-10 LAB — RENAL FUNCTION PANEL
Albumin: 3.8 g/dL (ref 3.5–5.2)
BUN: 23 mg/dL (ref 6–23)
CO2: 29 mEq/L (ref 19–32)
CREATININE: 0.8 mg/dL (ref 0.4–1.2)
Calcium: 9.6 mg/dL (ref 8.4–10.5)
Chloride: 105 mEq/L (ref 96–112)
GFR: 77.51 mL/min (ref >60.00)
GLUCOSE: 69 mg/dL — AB (ref 70–99)
POTASSIUM: 3.6 meq/L (ref 3.5–5.1)
Phosphorus: 2.5 mg/dL (ref 2.3–4.6)
Sodium: 142 mEq/L (ref 135–145)

## 2013-11-10 LAB — LIPID PANEL
CHOL/HDL RATIO: 3
Cholesterol: 126 mg/dL (ref 0–200)
HDL: 46.1 mg/dL (ref >39.00)
LDL Cholesterol: 64 mg/dL (ref 0–99)
Triglycerides: 81 mg/dL (ref 0.0–149.0)
VLDL: 16.2 mg/dL (ref 0.0–40.0)

## 2013-11-10 LAB — HEPATIC FUNCTION PANEL
ALK PHOS: 46 U/L (ref 39–117)
ALT: 19 U/L (ref 0–35)
AST: 20 U/L (ref 0–37)
Albumin: 3.8 g/dL (ref 3.5–5.2)
BILIRUBIN DIRECT: 0.1 mg/dL (ref 0.0–0.3)
BILIRUBIN TOTAL: 0.5 mg/dL (ref 0.3–1.2)
Total Protein: 6.7 g/dL (ref 6.0–8.3)

## 2013-11-10 LAB — CBC
HCT: 38.7 % (ref 36.0–46.0)
Hemoglobin: 12.7 g/dL (ref 12.0–15.0)
MCHC: 32.9 g/dL (ref 30.0–36.0)
MCV: 91.7 fl (ref 78.0–100.0)
Platelets: 245 10*3/uL (ref 150.0–400.0)
RBC: 4.22 Mil/uL (ref 3.87–5.11)
RDW: 14.9 % — ABNORMAL HIGH (ref 11.5–14.6)
WBC: 6 10*3/uL (ref 4.5–10.5)

## 2013-11-10 LAB — TSH: TSH: 2.53 u[IU]/mL (ref 0.35–5.50)

## 2013-11-10 NOTE — Telephone Encounter (Signed)
Patient went to [facility]. i will reorder labs

## 2013-11-11 ENCOUNTER — Other Ambulatory Visit: Payer: Self-pay | Admitting: Family Medicine

## 2013-11-16 ENCOUNTER — Encounter: Payer: Self-pay | Admitting: Family Medicine

## 2013-11-17 ENCOUNTER — Encounter: Payer: Self-pay | Admitting: Family Medicine

## 2013-11-17 ENCOUNTER — Ambulatory Visit (INDEPENDENT_AMBULATORY_CARE_PROVIDER_SITE_OTHER): Payer: 59 | Admitting: Family Medicine

## 2013-11-17 VITALS — BP 150/70 | HR 90 | Temp 98.5°F | Ht 65.5 in | Wt 143.0 lb

## 2013-11-17 DIAGNOSIS — R32 Unspecified urinary incontinence: Secondary | ICD-10-CM

## 2013-11-17 DIAGNOSIS — E782 Mixed hyperlipidemia: Secondary | ICD-10-CM

## 2013-11-17 DIAGNOSIS — M818 Other osteoporosis without current pathological fracture: Secondary | ICD-10-CM

## 2013-11-17 DIAGNOSIS — I10 Essential (primary) hypertension: Secondary | ICD-10-CM

## 2013-11-17 NOTE — Progress Notes (Signed)
Pre visit review using our clinic review tool, if applicable. No additional management support is needed unless otherwise documented below in the visit note. 

## 2013-11-22 ENCOUNTER — Encounter: Payer: Self-pay | Admitting: Family Medicine

## 2013-11-22 DIAGNOSIS — R32 Unspecified urinary incontinence: Secondary | ICD-10-CM

## 2013-11-22 HISTORY — DX: Unspecified urinary incontinence: R32

## 2013-11-22 NOTE — Assessment & Plan Note (Signed)
Tolerating statin, encouraged heart healthy diet, avoid trans fats, minimize simple carbs and saturated fats. Increase exercise as tolerated 

## 2013-11-22 NOTE — Progress Notes (Signed)
Patient ID: Lori Bright, female   DOB: 04/19/1924, 80 y.o.   MRN: [id number] Jossie Smoot [id number] September 06, 1923 11/22/2013      Progress Note-Follow Up  Subjective  Chief Complaint  Chief Complaint  Patient presents with  . Follow-up    4 month    HPI  Patient is a 80 year old female in today for routine medical care. She is doing well. Stays active with [employer] and Yoga. No recent illness except for an episode of gastroenteritis last month which has fully resolved. Denies CP/palp/SOB/HA/congestion/fevers/GI or GU c/o. Taking meds as prescribed  Past Medical History  Diagnosis Date  . Cervical cancer screening 04/03/2011  . Wrist pain, right 04/03/2011  . Anemia 11/28/2011  . Hyperlipidemia 11/28/2011  . Parotiditis 10/18/2012  . Annual physical exam 05/24/2013  . Urinary incontinence 11/22/2013    Past Surgical History  Procedure Laterality Date  . Tonsillectomy    . Hemorrhoid surgery    . Cataract extraction      b/l    Family History  Problem Relation Age of Onset  . Hip fracture Mother 59  . Heart disease Mother     w/ mitral valve replacement  . Obesity Mother   . Cancer Father     hepatic/ cigar smoker  . Cancer Brother     esophageal/ smoker  . Glaucoma Brother   . Other Daughter     early hysterectomy for menometroraghia  . Deep vein thrombosis Son   . Hip fracture Maternal Grandmother   . Other Maternal Grandmother     bladder prolapse  . Hip fracture Paternal Grandfather   . Glaucoma Paternal Grandfather   . Deep vein thrombosis Son     History   Social History  . Marital Status: Married    Spouse Name: N/A    Number of Children: N/A  . Years of Education: N/A   Occupational History  . Not on file.   Social History Main Topics  . Smoking status: Never Smoker   . Smokeless tobacco: Never Used  . Alcohol Use: Yes     Comment: rare  . Drug Use: No  . Sexual Activity: Not on file   Other Topics Concern  . Not on file   Social History Narrative   . No narrative on file    Current Outpatient Prescriptions on File Prior to Visit  Medication Sig Dispense Refill  . atorvastatin (LIPITOR) 10 MG tablet TAKE 1 TABLET DAILY  90 tablet  1  . calcium carbonate (OS-CAL) 600 MG TABS Take 600 mg by mouth 2 (two) times daily with a meal.      . DILT-XR 120 MG 24 hr capsule TAKE 1 CAPSULE DAILY  90 capsule  1  . fish oil-omega-3 fatty acids 1000 MG capsule Take 2 g by mouth daily.        . hydrochlorothiazide (HYDRODIURIL) 25 MG tablet TAKE 1 TABLET (25 MG TOTAL) DAILY  90 tablet  0  . valsartan (DIOVAN) 160 MG tablet Take 1 tablet (160 mg total) by mouth daily.  90 tablet  1   No current facility-administered medications on file prior to visit.    No Known Allergies  Review of Systems  Review of Systems  Constitutional: Negative for fever and malaise/fatigue.  HENT: Negative for congestion.   Eyes: Negative for discharge.  Respiratory: Negative for shortness of breath.   Cardiovascular: Negative for chest pain, palpitations and leg swelling.  Gastrointestinal: Negative for nausea, abdominal pain and diarrhea.  Genitourinary: Negative for dysuria.  Musculoskeletal: Negative for falls.  Skin: Negative for rash.  Neurological: Negative for loss of consciousness and headaches.  Endo/Heme/Allergies: Negative for polydipsia.  Psychiatric/Behavioral: Negative for depression and suicidal ideas. The patient is not nervous/anxious and does not have insomnia.     Objective  BP 150/70  Pulse 90  Temp(Src) 98.5 F (36.9 C) (Oral)  Ht 5' 5.5" (1.664 m)  Wt 143 lb (64.864 kg)  BMI 23.43 kg/m2  SpO2 97%  Physical Exam  Physical Exam  Constitutional: She is oriented to person, place, and time and well-developed, well-nourished, and in no distress. No distress.  HENT:  Head: Normocephalic and atraumatic.  Eyes: Conjunctivae are normal.  Neck: Neck supple. No thyromegaly present.  Cardiovascular: Normal rate, regular rhythm and normal  heart sounds.   No murmur heard. Pulmonary/Chest: Effort normal and breath sounds normal. She has no wheezes.  Abdominal: She exhibits no distension and no mass.  Musculoskeletal: She exhibits no edema.  Lymphadenopathy:    She has no cervical adenopathy.  Neurological: She is alert and oriented to person, place, and time.  Skin: Skin is warm and dry. No rash noted. She is not diaphoretic.  Psychiatric: Memory, affect and judgment normal.    Lab Results  Component Value Date   TSH 2.53 11/10/2013   Lab Results  Component Value Date   WBC 6.0 11/10/2013   HGB 12.7 11/10/2013   HCT 38.7 11/10/2013   MCV 91.7 11/10/2013   PLT 245.0 11/10/2013   Lab Results  Component Value Date   CREATININE 0.8 11/10/2013   BUN 23 11/10/2013   NA 142 11/10/2013   K 3.6 11/10/2013   CL 105 11/10/2013   CO2 29 11/10/2013   Lab Results  Component Value Date   ALT 19 11/10/2013   AST 20 11/10/2013   ALKPHOS 46 11/10/2013   BILITOT 0.5 11/10/2013   Lab Results  Component Value Date   CHOL 126 11/10/2013   Lab Results  Component Value Date   HDL 46.10 11/10/2013   Lab Results  Component Value Date   LDLCALC 64 11/10/2013   Lab Results  Component Value Date   TRIG 81.0 11/10/2013   Lab Results  Component Value Date   CHOLHDL 3 11/10/2013     Assessment & Plan  ESSENTIAL HYPERTENSION, BENIGN Improved on recheck, no changes to meds. Encouraged heart healthy diet such as the DASH diet and exercise as tolerated.   OTHER OSTEOPOROSIS Stays very active with Yoga, Ta[employer] and regular exercise. Continue same  MIXED HYPERLIPIDEMIA Tolerating statin, encouraged heart healthy diet, avoid trans fats, minimize simple carbs and saturated fats. Increase exercise as tolerated

## 2013-11-22 NOTE — Assessment & Plan Note (Signed)
Improved on recheck, no changes to meds. Encouraged heart healthy diet such as the DASH diet and exercise as tolerated.  

## 2013-11-22 NOTE — Assessment & Plan Note (Signed)
Stays very active with Yoga, [employer] and regular exercise. Continue same

## 2014-02-07 ENCOUNTER — Other Ambulatory Visit: Payer: Self-pay | Admitting: Family Medicine

## 2014-03-23 ENCOUNTER — Other Ambulatory Visit: Payer: Self-pay | Admitting: Family Medicine

## 2014-04-10 ENCOUNTER — Other Ambulatory Visit: Payer: Self-pay | Admitting: Family Medicine

## 2014-04-22 ENCOUNTER — Encounter: Payer: Self-pay | Admitting: Family Medicine

## 2014-04-22 ENCOUNTER — Ambulatory Visit (INDEPENDENT_AMBULATORY_CARE_PROVIDER_SITE_OTHER): Payer: Medicare Other | Admitting: Family Medicine

## 2014-04-22 VITALS — BP 142/53 | HR 67 | Temp 97.7°F | Ht 65.5 in | Wt 136.4 lb

## 2014-04-22 DIAGNOSIS — Z23 Encounter for immunization: Secondary | ICD-10-CM

## 2014-04-22 DIAGNOSIS — E782 Mixed hyperlipidemia: Secondary | ICD-10-CM

## 2014-04-22 DIAGNOSIS — E559 Vitamin D deficiency, unspecified: Secondary | ICD-10-CM

## 2014-04-22 DIAGNOSIS — F32A Depression, unspecified: Secondary | ICD-10-CM

## 2014-04-22 DIAGNOSIS — F329 Major depressive disorder, single episode, unspecified: Secondary | ICD-10-CM

## 2014-04-22 DIAGNOSIS — E876 Hypokalemia: Secondary | ICD-10-CM

## 2014-04-22 DIAGNOSIS — Z0000 Encounter for general adult medical examination without abnormal findings: Secondary | ICD-10-CM

## 2014-04-22 DIAGNOSIS — D649 Anemia, unspecified: Secondary | ICD-10-CM

## 2014-04-22 DIAGNOSIS — I10 Essential (primary) hypertension: Secondary | ICD-10-CM

## 2014-04-22 LAB — CBC
HCT: 39.1 % (ref 36.0–46.0)
Hemoglobin: 12.5 g/dL (ref 12.0–15.0)
MCHC: 32 g/dL (ref 30.0–36.0)
MCV: 91.9 fl (ref 78.0–100.0)
Platelets: 201 10*3/uL (ref 150.0–400.0)
RBC: 4.25 Mil/uL (ref 3.87–5.11)
RDW: 14.6 % (ref 11.5–15.5)
WBC: 6.8 10*3/uL (ref 4.0–10.5)

## 2014-04-22 LAB — LIPID PANEL
CHOL/HDL RATIO: 3
Cholesterol: 145 mg/dL (ref 0–200)
HDL: 43 mg/dL (ref >39.00)
LDL CALC: 79 mg/dL (ref 0–99)
NONHDL: 102
Triglycerides: 114 mg/dL (ref 0.0–149.0)
VLDL: 22.8 mg/dL (ref 0.0–40.0)

## 2014-04-22 LAB — HEPATIC FUNCTION PANEL
ALK PHOS: 48 U/L (ref 39–117)
ALT: 18 U/L (ref 0–35)
AST: 24 U/L (ref 0–37)
Albumin: 3.7 g/dL (ref 3.5–5.2)
BILIRUBIN DIRECT: 0.1 mg/dL (ref 0.0–0.3)
BILIRUBIN TOTAL: 0.5 mg/dL (ref 0.2–1.2)
Total Protein: 7.4 g/dL (ref 6.0–8.3)

## 2014-04-22 LAB — RENAL FUNCTION PANEL
Albumin: 3.7 g/dL (ref 3.5–5.2)
BUN: 19 mg/dL (ref 6–23)
CALCIUM: 9.6 mg/dL (ref 8.4–10.5)
CO2: 31 meq/L (ref 19–32)
Chloride: 103 mEq/L (ref 96–112)
Creatinine, Ser: 0.7 mg/dL (ref 0.4–1.2)
GFR: 78.64 mL/min (ref >60.00)
Glucose, Bld: 92 mg/dL (ref 70–99)
POTASSIUM: 3.3 meq/L — AB (ref 3.5–5.1)
Phosphorus: 3.3 mg/dL (ref 2.3–4.6)
SODIUM: 141 meq/L (ref 135–145)

## 2014-04-22 LAB — TSH: TSH: 2.23 u[IU]/mL (ref 0.35–4.50)

## 2014-04-22 NOTE — Patient Instructions (Signed)

## 2014-04-22 NOTE — Progress Notes (Signed)
Pre visit review using our clinic review tool, if applicable. No additional management support is needed unless otherwise documented below in the visit note. 

## 2014-04-25 ENCOUNTER — Encounter: Payer: Self-pay | Admitting: Family Medicine

## 2014-04-25 DIAGNOSIS — F32A Depression, unspecified: Secondary | ICD-10-CM

## 2014-04-25 DIAGNOSIS — E876 Hypokalemia: Secondary | ICD-10-CM | POA: Insufficient documentation

## 2014-04-25 DIAGNOSIS — F329 Major depressive disorder, single episode, unspecified: Secondary | ICD-10-CM | POA: Insufficient documentation

## 2014-04-25 HISTORY — DX: Depression, unspecified: F32.A

## 2014-04-25 NOTE — Progress Notes (Signed)
Patient ID: Lori Bright, female   DOB: 1923/11/17, 80 y.o.   MRN: [id number] Lori Bright [id number] 15-Feb-1924 04/25/2014      Progress Note-Follow Up  Subjective 15 Chief Complaint  Chief Complaint  Patient presents with  . Annual Exam    medicare wellness  . Injections    flu    HPI  Patient is a 80 year old female in today for routine medical care. She is in today for annual exam. Continues to stay very active with bicycling, he'll tai chi in the gym. He is eating well but acknowledges poor appetite. Acknowledges low mood and anhedonia since that time. No suicidal ideation. No recent illness or other new concerns. Denies CP/palp/SOB/HA/congestion/fevers/GI or GU c/o. Taking meds as prescribed  Past Medical History  Diagnosis Date  . Cervical cancer screening 04/03/2011  . Wrist pain, right 04/03/2011  . Anemia 11/28/2011  . Hyperlipidemia 11/28/2011  . Parotiditis 10/18/2012  . Annual physical exam 05/24/2013  . Urinary incontinence 11/22/2013  . Depression 04/25/2014  . Medicare annual wellness visit, subsequent 05/24/2013    Past Surgical History  Procedure Laterality Date  . Tonsillectomy    . Hemorrhoid surgery    . Cataract extraction      b/l    Family History  Problem Relation Age of Onset  . Hip fracture Mother 36  . Heart disease Mother     w/ mitral valve replacement  . Obesity Mother   . Cancer Father     hepatic/ cigar smoker  . Cancer Brother     esophageal/ smoker  . Glaucoma Brother   . Other Daughter     early hysterectomy for menometroraghia  . Deep vein thrombosis Son   . Hip fracture Maternal Grandmother   . Other Maternal Grandmother     bladder prolapse  . Hip fracture Paternal Grandfather   . Glaucoma Paternal Grandfather   . Deep vein thrombosis Son     History   Social History  . Marital Status: Married    Spouse Name: N/A    Number of Children: N/A  . Years of Education: N/A   Occupational History  . Not on file.   Social History  Main Topics  . Smoking status: Never Smoker   . Smokeless tobacco: Never Used  . Alcohol Use: Yes     Comment: rare  . Drug Use: No  . Sexual Activity: Not on file   Other Topics Concern  . Not on file   Social History Narrative  . No narrative on file    Current Outpatient Prescriptions on File Prior to Visit  Medication Sig Dispense Refill  . atorvastatin (LIPITOR) 10 MG tablet TAKE 1 TABLET DAILY  90 tablet  0  . calcium carbonate (OS-CAL) 600 MG TABS Take 1,200 mg by mouth daily with breakfast.       . DILT-XR 120 MG 24 hr capsule TAKE 1 CAPSULE DAILY  90 capsule  0  . fish oil-omega-3 fatty acids 1000 MG capsule Take 1 g by mouth daily.       . hydrochlorothiazide (HYDRODIURIL) 25 MG tablet TAKE 1 TABLET DAILY  90 tablet  1  . valsartan (DIOVAN) 160 MG tablet TAKE 1 TABLET DAILY  90 tablet  0   No current facility-administered medications on file prior to visit.    No Known Allergies  Review of Systems  Review of Systems  Constitutional: Positive for malaise/fatigue. Negative for fever and chills.  HENT: Negative for congestion, hearing  loss and nosebleeds.   Eyes: Negative for discharge.  Respiratory: Negative for cough, sputum production, shortness of breath and wheezing.   Cardiovascular: Negative for chest pain, palpitations and leg swelling.  Gastrointestinal: Negative for heartburn, nausea, vomiting, abdominal pain, diarrhea, constipation and blood in stool.  Genitourinary: Negative for dysuria, urgency, frequency and hematuria.  Musculoskeletal: Negative for back pain, falls and myalgias.  Skin: Negative for rash.  Neurological: Negative for dizziness, tremors, sensory change, focal weakness, loss of consciousness, weakness and headaches.  Endo/Heme/Allergies: Negative for polydipsia. Does not bruise/bleed easily.  Psychiatric/Behavioral: Positive for depression. Negative for suicidal ideas. The patient is not nervous/anxious and does not have insomnia.      Objective  BP 142/53  Pulse 67  Temp(Src) 97.7 F (36.5 C) (Oral)  Ht 5' 5.5" (1.664 m)  Wt 136 lb 6.4 oz (61.871 kg)  BMI 22.35 kg/m2  SpO2 100%  Physical Exam  Physical Exam  Constitutional: She is oriented to person, place, and time and well-developed, well-nourished, and in no distress. No distress.  HENT:  Head: Normocephalic and atraumatic.  Right Ear: External ear normal.  Left Ear: External ear normal.  Nose: Nose normal.  Mouth/Throat: Oropharynx is clear and moist. No oropharyngeal exudate.  Eyes: Conjunctivae are normal. Pupils are equal, round, and reactive to light. Right eye exhibits no discharge. Left eye exhibits no discharge. No scleral icterus.  Neck: Normal range of motion. Neck supple. No thyromegaly present.  Cardiovascular: Normal rate, regular rhythm, normal heart sounds and intact distal pulses.   No murmur heard. Pulmonary/Chest: Effort normal and breath sounds normal. No respiratory distress. She has no wheezes. She has no rales.  Abdominal: Soft. Bowel sounds are normal. She exhibits no distension and no mass. There is no tenderness.  Musculoskeletal: Normal range of motion. She exhibits no edema and no tenderness.  Lymphadenopathy:    She has no cervical adenopathy.  Neurological: She is alert and oriented to person, place, and time. She has normal reflexes. No cranial nerve deficit. Coordination normal.  Skin: Skin is warm and dry. No rash noted. She is not diaphoretic.  Psychiatric: Mood, memory and affect normal.    Lab Results  Component Value Date   TSH 2.23 04/22/2014   Lab Results  Component Value Date   WBC 6.8 04/22/2014   HGB 12.5 04/22/2014   HCT 39.1 04/22/2014   MCV 91.9 04/22/2014   PLT 201.0 04/22/2014   Lab Results  Component Value Date   CREATININE 0.7 04/22/2014   BUN 19 04/22/2014   NA 141 04/22/2014   K 3.3* 04/22/2014   CL 103 04/22/2014   CO2 31 04/22/2014   Lab Results  Component Value Date   ALT 18 04/22/2014    AST 24 04/22/2014   ALKPHOS 48 04/22/2014   BILITOT 0.5 04/22/2014   Lab Results  Component Value Date   CHOL 145 04/22/2014   Lab Results  Component Value Date   HDL 43.00 04/22/2014   Lab Results  Component Value Date   LDLCALC 79 04/22/2014   Lab Results  Component Value Date   TRIG 114.0 04/22/2014   Lab Results  Component Value Date   CHOLHDL 3 04/22/2014     Assessment & Plan  ESSENTIAL HYPERTENSION, BENIGN Well controlled, no changes to meds. Encouraged heart healthy diet such as the DASH diet and exercise as tolerated.   MIXED HYPERLIPIDEMIA Tolerating statin, encouraged heart healthy diet, avoid trans fats, minimize simple carbs and saturated fats. Increase exercise as  tolerated  OTHER OSTEOPOROSIS Is staying very active and taking calcium and vitamin D supplements. Encouraged the same  Potassium deficiency Mild, asymptomatic. Increase in diet, report any concerning symptoms and monitor  Medicare annual wellness visit, subsequent Patient denies any difficulties at home. No trouble with ADLs, depression or falls. No recent changes to vision or hearing. Is UTD with immunizations. Is UTD with screening. Discussed Advanced Directives, patient agrees to bring [place] copies of documents if can. Encouraged heart healthy diet, exercise as tolerated and adequate sleep. Follows with dermatology MGM was 4/15 Pap 2012 no need for repeat Colonoscopy 2005, no recent concerns, will not likely need another scope Return in 6 months for surveillance or as needed Flu shot today  Depression Acknowledges she is still blue since the death of her husband. She declines meds or counseling for now and has trips scheduled to visit her distant children soon. She will let us know if worsens or she wants to consider treatment

## 2014-04-25 NOTE — Assessment & Plan Note (Addendum)
Patient denies any difficulties at home. No trouble with ADLs, depression or falls. No recent changes to vision or hearing. Is UTD with immunizations. Is UTD with screening. Discussed Advanced Directives, patient agrees to bring [place] copies of documents if can. Encouraged heart healthy diet, exercise as tolerated and adequate sleep. Follows with dermatology MGM was 4/15 Pap 2012 no need for repeat Colonoscopy 2005, no recent concerns, will not likely need another scope Return in 6 months for surveillance or as needed Flu shot today

## 2014-04-25 NOTE — Assessment & Plan Note (Signed)
Well controlled, no changes to meds. Encouraged heart healthy diet such as the DASH diet and exercise as tolerated.  °

## 2014-04-25 NOTE — Assessment & Plan Note (Signed)
Mild, asymptomatic. Increase in diet, report any concerning symptoms and monitor

## 2014-04-25 NOTE — Assessment & Plan Note (Signed)
Tolerating statin, encouraged heart healthy diet, avoid trans fats, minimize simple carbs and saturated fats. Increase exercise as tolerated 

## 2014-04-25 NOTE — Assessment & Plan Note (Signed)
Acknowledges she is still blue since the death of her husband. She declines meds or counseling for now and has trips scheduled to visit her distant children soon. She will let us know if worsens or she wants to consider treatment

## 2014-04-25 NOTE — Assessment & Plan Note (Signed)
Is staying very active and taking calcium and vitamin D supplements. Encouraged the same

## 2014-05-03 ENCOUNTER — Telehealth: Payer: Self-pay | Admitting: Family Medicine

## 2014-05-03 NOTE — Telephone Encounter (Signed)
Labs were Already ordered on 04-23-14

## 2014-05-03 NOTE — Telephone Encounter (Signed)
Caller name: Lori Bright, Lori Bright Relation to pt: self  Call back [phone]   Reason for call: pt would like lab work 05/06/14 at the [facility] site requesting orders. Please advise

## 2014-05-06 ENCOUNTER — Other Ambulatory Visit (INDEPENDENT_AMBULATORY_CARE_PROVIDER_SITE_OTHER): Payer: Medicare Other

## 2014-05-06 DIAGNOSIS — E876 Hypokalemia: Secondary | ICD-10-CM

## 2014-05-06 LAB — RENAL FUNCTION PANEL
Albumin: 3.5 g/dL (ref 3.5–5.2)
BUN: 24 mg/dL — ABNORMAL HIGH (ref 6–23)
CO2: 23 mEq/L (ref 19–32)
CREATININE: 0.8 mg/dL (ref 0.4–1.2)
Calcium: 9.8 mg/dL (ref 8.4–10.5)
Chloride: 102 mEq/L (ref 96–112)
GFR: 70.85 mL/min (ref >60.00)
GLUCOSE: 78 mg/dL (ref 70–99)
PHOSPHORUS: 3 mg/dL (ref 2.3–4.6)
Potassium: 3.4 mEq/L — ABNORMAL LOW (ref 3.5–5.1)
SODIUM: 140 meq/L (ref 135–145)

## 2014-06-03 ENCOUNTER — Other Ambulatory Visit (INDEPENDENT_AMBULATORY_CARE_PROVIDER_SITE_OTHER): Payer: Medicare Other

## 2014-06-03 DIAGNOSIS — E876 Hypokalemia: Secondary | ICD-10-CM

## 2014-06-03 LAB — RENAL FUNCTION PANEL
Albumin: 3.8 g/dL (ref 3.5–5.2)
BUN: 29 mg/dL — ABNORMAL HIGH (ref 6–23)
CO2: 31 mEq/L (ref 19–32)
CREATININE: 0.8 mg/dL (ref 0.4–1.2)
Calcium: 9.6 mg/dL (ref 8.4–10.5)
Chloride: 103 mEq/L (ref 96–112)
GFR: 75.1 mL/min (ref >60.00)
GLUCOSE: 73 mg/dL (ref 70–99)
PHOSPHORUS: 2.9 mg/dL (ref 2.3–4.6)
Potassium: 3.4 mEq/L — ABNORMAL LOW (ref 3.5–5.1)
SODIUM: 140 meq/L (ref 135–145)

## 2014-06-21 ENCOUNTER — Other Ambulatory Visit: Payer: Self-pay | Admitting: Family Medicine

## 2014-07-27 ENCOUNTER — Other Ambulatory Visit: Payer: Self-pay | Admitting: Family Medicine

## 2014-07-27 NOTE — Telephone Encounter (Signed)
Rx sent to the pharmacy by e-script.//AB/CMA 

## 2014-08-01 ENCOUNTER — Other Ambulatory Visit: Payer: Self-pay | Admitting: Family Medicine

## 2014-10-22 ENCOUNTER — Encounter: Payer: Self-pay | Admitting: Family Medicine

## 2014-10-22 ENCOUNTER — Ambulatory Visit (INDEPENDENT_AMBULATORY_CARE_PROVIDER_SITE_OTHER): Payer: Medicare Other | Admitting: Family Medicine

## 2014-10-22 VITALS — BP 124/74 | HR 74 | Temp 97.8°F | Resp 16 | Ht 65.0 in | Wt 134.0 lb

## 2014-10-22 DIAGNOSIS — E782 Mixed hyperlipidemia: Secondary | ICD-10-CM | POA: Diagnosis not present

## 2014-10-22 DIAGNOSIS — I10 Essential (primary) hypertension: Secondary | ICD-10-CM

## 2014-10-22 DIAGNOSIS — F32A Depression, unspecified: Secondary | ICD-10-CM

## 2014-10-22 DIAGNOSIS — F329 Major depressive disorder, single episode, unspecified: Secondary | ICD-10-CM

## 2014-10-22 MED ORDER — HYDROCHLOROTHIAZIDE 12.5 MG PO CAPS: 12.5000 mg | ORAL_CAPSULE | Freq: Every day | ORAL | Status: DC

## 2014-10-22 NOTE — Patient Instructions (Signed)

## 2014-10-30 ENCOUNTER — Encounter: Payer: Self-pay | Admitting: Family Medicine

## 2014-10-30 NOTE — Progress Notes (Signed)
Lori Bright  [id number] 1923/08/01 10/30/2014      Progress Note-Follow Up  Subjective  Chief Complaint  Chief Complaint  Patient presents with  . Follow-up    6 mo    HPI  Patient is a 81 y.o. female in today for routine medical care. Patient is in today for follow-up and is doing well. She continues to exercise regularly, doing regular yoga and tai chi. No recent illness or acute concerns. Denies CP/palp/SOB/HA/congestion/fevers/GI or GU c/o. Taking meds as prescribed  Past Medical History  Diagnosis Date  . Cervical cancer screening 04/03/2011  . Wrist pain, right 04/03/2011  . Anemia 11/28/2011  . Hyperlipidemia 11/28/2011  . Parotiditis 10/18/2012  . Annual physical exam 05/24/2013  . Urinary incontinence 11/22/2013  . Depression 04/25/2014  . Medicare annual wellness visit, subsequent 05/24/2013    Past Surgical History  Procedure Laterality Date  . Tonsillectomy    . Hemorrhoid surgery    . Cataract extraction      b/l    Family History  Problem Relation Age of Onset  . Hip fracture Mother 80  . Heart disease Mother     w/ mitral valve replacement  . Obesity Mother   . Cancer Father     hepatic/ cigar smoker  . Cancer Brother     esophageal/ smoker  . Glaucoma Brother   . Other Daughter     early hysterectomy for menometroraghia  . Deep vein thrombosis Son   . Hip fracture Maternal Grandmother   . Other Maternal Grandmother     bladder prolapse  . Hip fracture Paternal Grandfather   . Glaucoma Paternal Grandfather   . Deep vein thrombosis Son     History   Social History  . Marital Status: Married    Spouse Name: N/A  . Number of Children: N/A  . Years of Education: N/A   Occupational History  . Not on file.   Social History Main Topics  . Smoking status: Never Smoker   . Smokeless tobacco: Never Used  . Alcohol Use: Yes     Comment: rare  . Drug Use: No  . Sexual Activity: Not on file   Other Topics Concern  . Not on file   Social  History Narrative    Current Outpatient Prescriptions on File Prior to Visit  Medication Sig Dispense Refill  . atorvastatin (LIPITOR) 10 MG tablet TAKE 1 TABLET DAILY 90 tablet 1  . calcium carbonate (OS-CAL) 600 MG TABS Take 1,200 mg by mouth daily with breakfast.     . DILT-XR 120 MG 24 hr capsule TAKE 1 CAPSULE DAILY 90 capsule 1  . fish oil-omega-3 fatty acids 1000 MG capsule Take 1 g by mouth daily.     . valsartan (DIOVAN) 160 MG tablet TAKE 1 TABLET DAILY 90 tablet 1   No current facility-administered medications on file prior to visit.    No Known Allergies  Review of Systems  Review of Systems  Constitutional: Negative for fever and malaise/fatigue.  HENT: Negative for congestion.   Eyes: Negative for discharge.  Respiratory: Negative for shortness of breath.   Cardiovascular: Negative for chest pain, palpitations and leg swelling.  Gastrointestinal: Negative for nausea, abdominal pain and diarrhea.  Genitourinary: Negative for dysuria.  Musculoskeletal: Negative for falls.  Skin: Negative for rash.  Neurological: Negative for loss of consciousness and headaches.  Endo/Heme/Allergies: Negative for polydipsia.  Psychiatric/Behavioral: Negative for depression and suicidal ideas. The patient is not nervous/anxious and does not  have insomnia.     Objective  BP 124/74 mmHg  Pulse 74  Temp(Src) 97.8 F (36.6 C) (Oral)  Resp 16  Ht _0  (1.651 m)  Wt 134 lb (60.782 kg)  BMI 22.30 kg/m2  SpO2 98%  Physical Exam  Physical Exam  Constitutional: She is oriented to person, place, and time and well-developed, well-nourished, and in no distress. No distress.  HENT:  Head: Normocephalic and atraumatic.  Eyes: Conjunctivae are normal.  Neck: Neck supple. No thyromegaly present.  Cardiovascular: Normal rate, regular rhythm and normal heart sounds.   No murmur heard. Pulmonary/Chest: Effort normal and breath sounds normal. She has no wheezes.  Abdominal: She exhibits  no distension and no mass.  Musculoskeletal: She exhibits no edema.  Lymphadenopathy:    She has no cervical adenopathy.  Neurological: She is alert and oriented to person, place, and time.  Skin: Skin is warm and dry. No rash noted. She is not diaphoretic.  Psychiatric: Memory, affect and judgment normal.    Lab Results  Component Value Date   TSH 2.23 04/22/2014   Lab Results  Component Value Date   WBC 6.8 04/22/2014   HGB 12.5 04/22/2014   HCT 39.1 04/22/2014   MCV 91.9 04/22/2014   PLT 201.0 04/22/2014   Lab Results  Component Value Date   CREATININE 0.8 06/03/2014   BUN 29* 06/03/2014   NA 140 06/03/2014   K 3.4* 06/03/2014   CL 103 06/03/2014   CO2 31 06/03/2014   Lab Results  Component Value Date   ALT 18 04/22/2014   AST 24 04/22/2014   ALKPHOS 48 04/22/2014   BILITOT 0.5 04/22/2014   Lab Results  Component Value Date   CHOL 145 04/22/2014   Lab Results  Component Value Date   HDL 43.00 04/22/2014   Lab Results  Component Value Date   LDLCALC 79 04/22/2014   Lab Results  Component Value Date   TRIG 114.0 04/22/2014   Lab Results  Component Value Date   CHOLHDL 3 04/22/2014     Assessment & Plan  ESSENTIAL HYPERTENSION, BENIGN Well controlled, no changes to meds. Encouraged heart healthy diet such as the DASH diet and exercise as tolerated.    OTHER OSTEOPOROSIS Stays very physically active, encouraged ongoing calcium and vitamin D supplements.   MIXED HYPERLIPIDEMIA Tolerating statin, encouraged heart healthy diet, avoid trans fats, minimize simple carbs and saturated fats. Increase exercise as tolerated   Depression Is doing well and stays busy with friends, family and exercise

## 2014-10-30 NOTE — Assessment & Plan Note (Signed)
Well controlled, no changes to meds. Encouraged heart healthy diet such as the DASH diet and exercise as tolerated.  °

## 2014-10-30 NOTE — Assessment & Plan Note (Signed)
Stays very physically active, encouraged ongoing calcium and vitamin D supplements.

## 2014-10-30 NOTE — Assessment & Plan Note (Signed)
Tolerating statin, encouraged heart healthy diet, avoid trans fats, minimize simple carbs and saturated fats. Increase exercise as tolerated 

## 2014-10-30 NOTE — Assessment & Plan Note (Signed)
Is doing well and stays busy with friends, family and exercise

## 2014-11-02 ENCOUNTER — Other Ambulatory Visit: Payer: Self-pay | Admitting: Family Medicine

## 2014-11-03 ENCOUNTER — Other Ambulatory Visit: Payer: Self-pay | Admitting: Family Medicine

## 2014-11-03 DIAGNOSIS — I10 Essential (primary) hypertension: Secondary | ICD-10-CM

## 2014-11-03 DIAGNOSIS — E782 Mixed hyperlipidemia: Secondary | ICD-10-CM

## 2014-11-17 ENCOUNTER — Encounter: Payer: Self-pay | Admitting: Family Medicine

## 2014-11-25 ENCOUNTER — Other Ambulatory Visit: Payer: Self-pay | Admitting: Family Medicine

## 2014-12-22 ENCOUNTER — Telehealth: Payer: Self-pay | Admitting: Family Medicine

## 2014-12-22 NOTE — Telephone Encounter (Signed)
Caller name: Kyndell Zeiser Relationship to patient: self Can be reached: [phone] Pharmacy: [facility]  Reason for call: Pt called for DILT-XR 120 MG 24 hr capsule. She states that she did not receive order that we sent to them 11/25/14. She states they told her to confirm the refill with Dr. Charlett Blake. Pt has 7 days on hand.

## 2014-12-23 MED ORDER — DILTIAZEM HCL ER 120 MG PO CP24: 120.0000 mg | ORAL_CAPSULE | Freq: Every day | ORAL | Status: DC

## 2014-12-24 ENCOUNTER — Telehealth: Payer: Self-pay | Admitting: Family Medicine

## 2014-12-24 MED ORDER — ATORVASTATIN CALCIUM 10 MG PO TABS: 10.0000 mg | ORAL_TABLET | Freq: Every day | ORAL | Status: DC

## 2014-12-24 NOTE — Telephone Encounter (Signed)
Refill done and pt. informed

## 2014-12-24 NOTE — Telephone Encounter (Signed)
Relation to pt: self  Call back number: [phone] Pharmacy: [facility] [place] [phone] (Fax)         Reason for call:  Pt requesting a refill atorvastatin (LIPITOR) 10 MG tablet

## 2015-01-19 ENCOUNTER — Other Ambulatory Visit: Payer: Self-pay | Admitting: Family Medicine

## 2015-05-02 ENCOUNTER — Other Ambulatory Visit: Payer: Self-pay | Admitting: Family Medicine

## 2015-05-24 ENCOUNTER — Encounter: Payer: Self-pay | Admitting: Family Medicine

## 2015-05-24 ENCOUNTER — Ambulatory Visit (INDEPENDENT_AMBULATORY_CARE_PROVIDER_SITE_OTHER): Payer: Medicare Other | Admitting: Family Medicine

## 2015-05-24 VITALS — BP 132/84 | HR 81 | Temp 98.3°F | Ht 65.0 in | Wt 137.4 lb

## 2015-05-24 DIAGNOSIS — M810 Age-related osteoporosis without current pathological fracture: Secondary | ICD-10-CM

## 2015-05-24 DIAGNOSIS — Z23 Encounter for immunization: Secondary | ICD-10-CM

## 2015-05-24 DIAGNOSIS — I10 Essential (primary) hypertension: Secondary | ICD-10-CM

## 2015-05-24 DIAGNOSIS — E782 Mixed hyperlipidemia: Secondary | ICD-10-CM

## 2015-05-24 DIAGNOSIS — E559 Vitamin D deficiency, unspecified: Secondary | ICD-10-CM

## 2015-05-24 DIAGNOSIS — Z0000 Encounter for general adult medical examination without abnormal findings: Secondary | ICD-10-CM

## 2015-05-24 LAB — COMPREHENSIVE METABOLIC PANEL
ALK PHOS: 56 U/L (ref 39–117)
ALT: 17 U/L (ref 0–35)
AST: 19 U/L (ref 0–37)
Albumin: 4.3 g/dL (ref 3.5–5.2)
BUN: 21 mg/dL (ref 6–23)
CALCIUM: 10 mg/dL (ref 8.4–10.5)
CO2: 29 meq/L (ref 19–32)
Chloride: 105 mEq/L (ref 96–112)
Creatinine, Ser: 0.69 mg/dL (ref 0.40–1.20)
GFR: 85.04 mL/min (ref >60.00)
Glucose, Bld: 98 mg/dL (ref 70–99)
Potassium: 3.6 mEq/L (ref 3.5–5.1)
Sodium: 142 mEq/L (ref 135–145)
Total Bilirubin: 0.5 mg/dL (ref 0.2–1.2)
Total Protein: 7.1 g/dL (ref 6.0–8.3)

## 2015-05-24 LAB — VITAMIN D 25 HYDROXY (VIT D DEFICIENCY, FRACTURES): VITD: 32.94 ng/mL (ref 30.00–100.00)

## 2015-05-24 LAB — LIPID PANEL
CHOL/HDL RATIO: 3
Cholesterol: 141 mg/dL (ref 0–200)
HDL: 48.4 mg/dL (ref >39.00)
LDL Cholesterol: 71 mg/dL (ref 0–99)
NONHDL: 92.69
Triglycerides: 107 mg/dL (ref 0.0–149.0)
VLDL: 21.4 mg/dL (ref 0.0–40.0)

## 2015-05-24 LAB — CBC
HCT: 40.2 % (ref 36.0–46.0)
Hemoglobin: 12.8 g/dL (ref 12.0–15.0)
MCHC: 31.9 g/dL (ref 30.0–36.0)
MCV: 90.9 fl (ref 78.0–100.0)
PLATELETS: 194 10*3/uL (ref 150.0–400.0)
RBC: 4.43 Mil/uL (ref 3.87–5.11)
RDW: 14.4 % (ref 11.5–15.5)
WBC: 7.1 10*3/uL (ref 4.0–10.5)

## 2015-05-24 LAB — TSH: TSH: 1.85 u[IU]/mL (ref 0.35–4.50)

## 2015-05-24 MED ORDER — HYDROCHLOROTHIAZIDE 12.5 MG PO CAPS: 12.5000 mg | ORAL_CAPSULE | Freq: Every day | ORAL | Status: DC

## 2015-05-24 NOTE — Patient Instructions (Signed)
Preventive Care for Adults, Female A healthy lifestyle and preventive care can promote health and wellness. Preventive health guidelines for women include the following key practices.  A routine yearly physical is a good way to check with your health care provider about your health and preventive screening. It is a chance to share any concerns and updates on your health and to receive a thorough exam.  Visit your dentist for a routine exam and preventive care every 6 months. Brush your teeth twice a day and floss once a day. Good oral hygiene prevents tooth decay and gum disease.  The frequency of eye exams is based on your age, health, family medical history, use of contact lenses, and other factors. Follow your health care provider's recommendations for frequency of eye exams.  Eat a healthy diet. Foods like vegetables, fruits, whole grains, low-fat dairy products, and lean protein foods contain the nutrients you need without too many calories. Decrease your intake of foods high in solid fats, added sugars, and salt. Eat the right amount of calories for you.Get information about a proper diet from your health care provider, if necessary.  Regular physical exercise is one of the most important things you can do for your health. Most adults should get at least 150 minutes of moderate-intensity exercise (any activity that increases your heart rate and causes you to sweat) each week. In addition, most adults need muscle-strengthening exercises on 2 or more days a week.  Maintain a healthy weight. The body mass index (BMI) is a screening tool to identify possible weight problems. It provides an estimate of body fat based on height and weight. Your health care provider can find your BMI and can help you achieve or maintain a healthy weight.For adults 20 years and older:  A BMI below 18.5 is considered underweight.  A BMI of 18.5 to 24.9 is normal.  A BMI of 25 to 29.9 is considered overweight.  A  BMI of 30 and above is considered obese.  Maintain normal blood lipids and cholesterol levels by exercising and minimizing your intake of saturated fat. Eat a balanced diet with plenty of fruit and vegetables. Blood tests for lipids and cholesterol should begin at age 45 and be repeated every 5 years. If your lipid or cholesterol levels are high, you are over 50, or you are at high risk for heart disease, you may need your cholesterol levels checked more frequently.Ongoing high lipid and cholesterol levels should be treated with medicines if diet and exercise are not working.  If you smoke, find out from your health care provider how to quit. If you do not use tobacco, do not start.  Lung cancer screening is recommended for adults aged 45-80 years who are at high risk for developing lung cancer because of a history of smoking. A yearly low-dose CT scan of the lungs is recommended for people who have at least a 30-pack-year history of smoking and are a current smoker or have quit within the past 15 years. A pack year of smoking is smoking an average of 1 pack of cigarettes a day for 1 year (for example: 1 pack a day for 30 years or 2 packs a day for 15 years). Yearly screening should continue until the smoker has stopped smoking for at least 15 years. Yearly screening should be stopped for people who develop a health problem that would prevent them from having lung cancer treatment.  If you are pregnant, do not drink alcohol. If you are  breastfeeding, be very cautious about drinking alcohol. If you are not pregnant and choose to drink alcohol, do not have more than 1 drink per day. One drink is considered to be 12 ounces (355 mL) of beer, 5 ounces (148 mL) of wine, or 1.5 ounces (44 mL) of liquor.  Avoid use of street drugs. Do not share needles with anyone. Ask for help if you need support or instructions about stopping the use of drugs.  High blood pressure causes heart disease and increases the risk  of stroke. Your blood pressure should be checked at least every 1 to 2 years. Ongoing high blood pressure should be treated with medicines if weight loss and exercise do not work.  If you are 55-79 years old, ask your health care provider if you should take aspirin to prevent strokes.  Diabetes screening is done by taking a blood sample to check your blood glucose level after you have not eaten for a certain period of time (fasting). If you are not overweight and you do not have risk factors for diabetes, you should be screened once every 3 years starting at age 45. If you are overweight or obese and you are 40-70 years of age, you should be screened for diabetes every year as part of your cardiovascular risk assessment.  Breast cancer screening is essential preventive care for women. You should practice "breast self-awareness." This means understanding the normal appearance and feel of your breasts and may include breast self-examination. Any changes detected, no matter how small, should be reported to a health care provider. Women in their 20s and 30s should have a clinical breast exam (CBE) by a health care provider as part of a regular health exam every 1 to 3 years. After age 40, women should have a CBE every year. Starting at age 40, women should consider having a mammogram (breast X-ray test) every year. Women who have a family history of breast cancer should talk to their health care provider about genetic screening. Women at a high risk of breast cancer should talk to their health care providers about having an MRI and a mammogram every year.  Breast cancer gene (BRCA)-related cancer risk assessment is recommended for women who have family members with BRCA-related cancers. BRCA-related cancers include breast, ovarian, tubal, and peritoneal cancers. Having family members with these cancers may be associated with an increased risk for harmful changes (mutations) in the breast cancer genes BRCA1 and  BRCA2. Results of the assessment will determine the need for genetic counseling and BRCA1 and BRCA2 testing.  Your health care provider may recommend that you be screened regularly for cancer of the pelvic organs (ovaries, uterus, and vagina). This screening involves a pelvic examination, including checking for microscopic changes to the surface of your cervix (Pap test). You may be encouraged to have this screening done every 3 years, beginning at age 21.  For women ages 30-65, health care providers may recommend pelvic exams and Pap testing every 3 years, or they may recommend the Pap and pelvic exam, combined with testing for human papilloma virus (HPV), every 5 years. Some types of HPV increase your risk of cervical cancer. Testing for HPV may also be done on women of any age with unclear Pap test results.  Other health care providers may not recommend any screening for nonpregnant women who are considered low risk for pelvic cancer and who do not have symptoms. Ask your health care provider if a screening pelvic exam is right for   you.  If you have had past treatment for cervical cancer or a condition that could lead to cancer, you need Pap tests and screening for cancer for at least 20 years after your treatment. If Pap tests have been discontinued, your risk factors (such as having a new sexual partner) need to be reassessed to determine if screening should resume. Some women have medical problems that increase the chance of getting cervical cancer. In these cases, your health care provider may recommend more frequent screening and Pap tests.  Colorectal cancer can be detected and often prevented. Most routine colorectal cancer screening begins at the age of 50 years and continues through age 75 years. However, your health care provider may recommend screening at an earlier age if you have risk factors for colon cancer. On a yearly basis, your health care provider may provide home test kits to check  for hidden blood in the stool. Use of a small camera at the end of a tube, to directly examine the colon (sigmoidoscopy or colonoscopy), can detect the earliest forms of colorectal cancer. Talk to your health care provider about this at age 50, when routine screening begins. Direct exam of the colon should be repeated every 5-10 years through age 75 years, unless early forms of precancerous polyps or small growths are found.  People who are at an increased risk for hepatitis B should be screened for this virus. You are considered at high risk for hepatitis B if:  You were born in a country where hepatitis B occurs often. Talk with your health care provider about which countries are considered high risk.  Your parents were born in a high-risk country and you have not received a shot to protect against hepatitis B (hepatitis B vaccine).  You have HIV or AIDS.  You use needles to inject street drugs.  You live with, or have sex with, someone who has hepatitis B.  You get hemodialysis treatment.  You take certain medicines for conditions like cancer, organ transplantation, and autoimmune conditions.  Hepatitis C blood testing is recommended for all people born from 1945 through 1965 and any individual with known risks for hepatitis C.  Practice safe sex. Use condoms and avoid high-risk sexual practices to reduce the spread of sexually transmitted infections (STIs). STIs include gonorrhea, chlamydia, syphilis, trichomonas, herpes, HPV, and human immunodeficiency virus (HIV). Herpes, HIV, and HPV are viral illnesses that have no cure. They can result in disability, cancer, and death.  You should be screened for sexually transmitted illnesses (STIs) including gonorrhea and chlamydia if:  You are sexually active and are younger than 24 years.  You are older than 24 years and your health care provider tells you that you are at risk for this type of infection.  Your sexual activity has changed  since you were last screened and you are at an increased risk for chlamydia or gonorrhea. Ask your health care provider if you are at risk.  If you are at risk of being infected with HIV, it is recommended that you take a prescription medicine daily to prevent HIV infection. This is called preexposure prophylaxis (PrEP). You are considered at risk if:  You are sexually active and do not regularly use condoms or know the HIV status of your partner(s).  You take drugs by injection.  You are sexually active with a partner who has HIV.  Talk with your health care provider about whether you are at high risk of being infected with HIV. If   you choose to begin PrEP, you should first be tested for HIV. You should then be tested every 3 months for as long as you are taking PrEP.  Osteoporosis is a disease in which the bones lose minerals and strength with aging. This can result in serious bone fractures or breaks. The risk of osteoporosis can be identified using a bone density scan. Women ages 67 years and over and women at risk for fractures or osteoporosis should discuss screening with their health care providers. Ask your health care provider whether you should take a calcium supplement or vitamin D to reduce the rate of osteoporosis.  Menopause can be associated with physical symptoms and risks. Hormone replacement therapy is available to decrease symptoms and risks. You should talk to your health care provider about whether hormone replacement therapy is right for you.  Use sunscreen. Apply sunscreen liberally and repeatedly throughout the day. You should seek shade when your shadow is shorter than you. Protect yourself by wearing long sleeves, pants, a wide-brimmed hat, and sunglasses year round, whenever you are outdoors.  Once a month, do a whole body skin exam, using a mirror to look at the skin on your back. Tell your health care provider of new moles, moles that have irregular borders, moles that  are larger than a pencil eraser, or moles that have changed in shape or color.  Stay current with required vaccines (immunizations).  Influenza vaccine. All adults should be immunized every year.  Tetanus, diphtheria, and acellular pertussis (Td, Tdap) vaccine. Pregnant women should receive 1 dose of Tdap vaccine during each pregnancy. The dose should be obtained regardless of the length of time since the last dose. Immunization is preferred during the 27th-36th week of gestation. An adult who has not previously received Tdap or who does not know her vaccine status should receive 1 dose of Tdap. This initial dose should be followed by tetanus and diphtheria toxoids (Td) booster doses every 10 years. Adults with an unknown or incomplete history of completing a 3-dose immunization series with Td-containing vaccines should begin or complete a primary immunization series including a Tdap dose. Adults should receive a Td booster every 10 years.  Varicella vaccine. An adult without evidence of immunity to varicella should receive 2 doses or a second dose if she has previously received 1 dose. Pregnant females who do not have evidence of immunity should receive the first dose after pregnancy. This first dose should be obtained before leaving the health care facility. The second dose should be obtained 4-8 weeks after the first dose.  Human papillomavirus (HPV) vaccine. Females aged 13-26 years who have not received the vaccine previously should obtain the 3-dose series. The vaccine is not recommended for use in pregnant females. However, pregnancy testing is not needed before receiving a dose. If a female is found to be pregnant after receiving a dose, no treatment is needed. In that case, the remaining doses should be delayed until after the pregnancy. Immunization is recommended for any person with an immunocompromised condition through the age of 61 years if she did not get any or all doses earlier. During the  3-dose series, the second dose should be obtained 4-8 weeks after the first dose. The third dose should be obtained 24 weeks after the first dose and 16 weeks after the second dose.  Zoster vaccine. One dose is recommended for adults aged 30 years or older unless certain conditions are present.  Measles, mumps, and rubella (MMR) vaccine. Adults born  before 1957 generally are considered immune to measles and mumps. Adults born in 1957 or later should have 1 or more doses of MMR vaccine unless there is a contraindication to the vaccine or there is laboratory evidence of immunity to each of the three diseases. A routine second dose of MMR vaccine should be obtained at least 28 days after the first dose for students attending postsecondary schools, health care workers, or international travelers. People who received inactivated measles vaccine or an unknown type of measles vaccine during 1963-1967 should receive 2 doses of MMR vaccine. People who received inactivated mumps vaccine or an unknown type of mumps vaccine before 1979 and are at high risk for mumps infection should consider immunization with 2 doses of MMR vaccine. For females of childbearing age, rubella immunity should be determined. If there is no evidence of immunity, females who are not pregnant should be vaccinated. If there is no evidence of immunity, females who are pregnant should delay immunization until after pregnancy. Unvaccinated health care workers born before 1957 who lack laboratory evidence of measles, mumps, or rubella immunity or laboratory confirmation of disease should consider measles and mumps immunization with 2 doses of MMR vaccine or rubella immunization with 1 dose of MMR vaccine.  Pneumococcal 13-valent conjugate (PCV13) vaccine. When indicated, a person who is uncertain of his immunization history and has no record of immunization should receive the PCV13 vaccine. All adults 65 years of age and older should receive this  vaccine. An adult aged 19 years or older who has certain medical conditions and has not been previously immunized should receive 1 dose of PCV13 vaccine. This PCV13 should be followed with a dose of pneumococcal polysaccharide (PPSV23) vaccine. Adults who are at high risk for pneumococcal disease should obtain the PPSV23 vaccine at least 8 weeks after the dose of PCV13 vaccine. Adults older than 79 years of age who have normal immune system function should obtain the PPSV23 vaccine dose at least 1 year after the dose of PCV13 vaccine.  Pneumococcal polysaccharide (PPSV23) vaccine. When PCV13 is also indicated, PCV13 should be obtained first. All adults aged 65 years and older should be immunized. An adult younger than age 65 years who has certain medical conditions should be immunized. Any person who resides in a nursing home or long-term care facility should be immunized. An adult smoker should be immunized. People with an immunocompromised condition and certain other conditions should receive both PCV13 and PPSV23 vaccines. People with human immunodeficiency virus (HIV) infection should be immunized as soon as possible after diagnosis. Immunization during chemotherapy or radiation therapy should be avoided. Routine use of PPSV23 vaccine is not recommended for American Indians, [place] Natives, or people younger than 65 years unless there are medical conditions that require PPSV23 vaccine. When indicated, people who have unknown immunization and have no record of immunization should receive PPSV23 vaccine. One-time revaccination 5 years after the first dose of PPSV23 is recommended for people aged 19-64 years who have chronic kidney failure, nephrotic syndrome, asplenia, or immunocompromised conditions. People who received 1-2 doses of PPSV23 before age 65 years should receive another dose of PPSV23 vaccine at age 65 years or later if at least 5 years have passed since the previous dose. Doses of PPSV23 are not  needed for people immunized with PPSV23 at or after age 65 years.  Meningococcal vaccine. Adults with asplenia or persistent complement component deficiencies should receive 2 doses of quadrivalent meningococcal conjugate (MenACWY-D) vaccine. The doses should be obtained   at least 2 months apart. Microbiologists working with certain meningococcal bacteria, [place] recruits, people at risk during an outbreak, and people who travel to or live in countries with a high rate of meningitis should be immunized. A first-year college student up through age 34 years who is living in a residence hall should receive a dose if she did not receive a dose on or after her 16th birthday. Adults who have certain high-risk conditions should receive one or more doses of vaccine.  Hepatitis A vaccine. Adults who wish to be protected from this disease, have certain high-risk conditions, work with hepatitis A-infected animals, work in hepatitis A research labs, or travel to or work in countries with a high rate of hepatitis A should be immunized. Adults who were previously unvaccinated and who anticipate close contact with an international adoptee during the first 60 days after arrival in the [place] from a country with a high rate of hepatitis A should be immunized.  Hepatitis B vaccine. Adults who wish to be protected from this disease, have certain high-risk conditions, may be exposed to blood or other infectious body fluids, are household contacts or sex partners of hepatitis B positive people, are clients or workers in certain care facilities, or travel to or work in countries with a high rate of hepatitis B should be immunized.  Haemophilus influenzae type b (Hib) vaccine. A previously unvaccinated person with asplenia or sickle cell disease or having a scheduled splenectomy should receive 1 dose of Hib vaccine. Regardless of previous immunization, a recipient of a hematopoietic stem cell transplant should receive a  3-dose series 6-12 months after her successful transplant. Hib vaccine is not recommended for adults with HIV infection. Preventive Services / Frequency Ages 35 to 4 years  Blood pressure check.** / Every 3-5 years.  Lipid and cholesterol check.** / Every 5 years beginning at age 60.  Clinical breast exam.** / Every 3 years for women in their 71s and 10s.  BRCA-related cancer risk assessment.** / For women who have family members with a BRCA-related cancer (breast, ovarian, tubal, or peritoneal cancers).  Pap test.** / Every 2 years from ages 76 through 26. Every 3 years starting at age 61 through age 76 or 93 with a history of 3 consecutive normal Pap tests.  HPV screening.** / Every 3 years from ages 37 through ages 60 to 51 with a history of 3 consecutive normal Pap tests.  Hepatitis C blood test.** / For any individual with known risks for hepatitis C.  Skin self-exam. / Monthly.  Influenza vaccine. / Every year.  Tetanus, diphtheria, and acellular pertussis (Tdap, Td) vaccine.** / Consult your health care provider. Pregnant women should receive 1 dose of Tdap vaccine during each pregnancy. 1 dose of Td every 10 years.  Varicella vaccine.** / Consult your health care provider. Pregnant females who do not have evidence of immunity should receive the first dose after pregnancy.  HPV vaccine. / 3 doses over 6 months, if 93 and younger. The vaccine is not recommended for use in pregnant females. However, pregnancy testing is not needed before receiving a dose.  Measles, mumps, rubella (MMR) vaccine.** / You need at least 1 dose of MMR if you were born in 1957 or later. You may also need a 2nd dose. For females of childbearing age, rubella immunity should be determined. If there is no evidence of immunity, females who are not pregnant should be vaccinated. If there is no evidence of immunity, females who are  pregnant should delay immunization until after pregnancy.  Pneumococcal  13-valent conjugate (PCV13) vaccine.** / Consult your health care provider.  Pneumococcal polysaccharide (PPSV23) vaccine.** / 1 to 2 doses if you smoke cigarettes or if you have certain conditions.  Meningococcal vaccine.** / 1 dose if you are age 68 to 8 years and a Market researcher living in a residence hall, or have one of several medical conditions, you need to get vaccinated against meningococcal disease. You may also need additional booster doses.  Hepatitis A vaccine.** / Consult your health care provider.  Hepatitis B vaccine.** / Consult your health care provider.  Haemophilus influenzae type b (Hib) vaccine.** / Consult your health care provider. Ages 7 to 53 years  Blood pressure check.** / Every year.  Lipid and cholesterol check.** / Every 5 years beginning at age 25 years.  Lung cancer screening. / Every year if you are aged 11-80 years and have a 30-pack-year history of smoking and currently smoke or have quit within the past 15 years. Yearly screening is stopped once you have quit smoking for at least 15 years or develop a health problem that would prevent you from having lung cancer treatment.  Clinical breast exam.** / Every year after age 48 years.  BRCA-related cancer risk assessment.** / For women who have family members with a BRCA-related cancer (breast, ovarian, tubal, or peritoneal cancers).  Mammogram.** / Every year beginning at age 41 years and continuing for as long as you are in good health. Consult with your health care provider.  Pap test.** / Every 3 years starting at age 65 years through age 37 or 70 years with a history of 3 consecutive normal Pap tests.  HPV screening.** / Every 3 years from ages 72 years through ages 60 to 40 years with a history of 3 consecutive normal Pap tests.  Fecal occult blood test (FOBT) of stool. / Every year beginning at age 21 years and continuing until age 5 years. You may not need to do this test if you get  a colonoscopy every 10 years.  Flexible sigmoidoscopy or colonoscopy.** / Every 5 years for a flexible sigmoidoscopy or every 10 years for a colonoscopy beginning at age 35 years and continuing until age 48 years.  Hepatitis C blood test.** / For all people born from [birth year] through 1965 and any individual with known risks for hepatitis C.  Skin self-exam. / Monthly.  Influenza vaccine. / Every year.  Tetanus, diphtheria, and acellular pertussis (Tdap/Td) vaccine.** / Consult your health care provider. Pregnant women should receive 1 dose of Tdap vaccine during each pregnancy. 1 dose of Td every 10 years.  Varicella vaccine.** / Consult your health care provider. Pregnant females who do not have evidence of immunity should receive the first dose after pregnancy.  Zoster vaccine.** / 1 dose for adults aged 30 years or older.  Measles, mumps, rubella (MMR) vaccine.** / You need at least 1 dose of MMR if you were born in 1957 or later. You may also need a second dose. For females of childbearing age, rubella immunity should be determined. If there is no evidence of immunity, females who are not pregnant should be vaccinated. If there is no evidence of immunity, females who are pregnant should delay immunization until after pregnancy.  Pneumococcal 13-valent conjugate (PCV13) vaccine.** / Consult your health care provider.  Pneumococcal polysaccharide (PPSV23) vaccine.** / 1 to 2 doses if you smoke cigarettes or if you have certain conditions.  Meningococcal vaccine.** /  Consult your health care provider.  Hepatitis A vaccine.** / Consult your health care provider.  Hepatitis B vaccine.** / Consult your health care provider.  Haemophilus influenzae type b (Hib) vaccine.** / Consult your health care provider. Ages 64 years and over  Blood pressure check.** / Every year.  Lipid and cholesterol check.** / Every 5 years beginning at age 23 years.  Lung cancer screening. / Every year if you  are aged 16-80 years and have a 30-pack-year history of smoking and currently smoke or have quit within the past 15 years. Yearly screening is stopped once you have quit smoking for at least 15 years or develop a health problem that would prevent you from having lung cancer treatment.  Clinical breast exam.** / Every year after age 74 years.  BRCA-related cancer risk assessment.** / For women who have family members with a BRCA-related cancer (breast, ovarian, tubal, or peritoneal cancers).  Mammogram.** / Every year beginning at age 44 years and continuing for as long as you are in good health. Consult with your health care provider.  Pap test.** / Every 3 years starting at age 58 years through age 22 or 39 years with 3 consecutive normal Pap tests. Testing can be stopped between 65 and 70 years with 3 consecutive normal Pap tests and no abnormal Pap or HPV tests in the past 10 years.  HPV screening.** / Every 3 years from ages 64 years through ages 70 or 61 years with a history of 3 consecutive normal Pap tests. Testing can be stopped between 65 and 70 years with 3 consecutive normal Pap tests and no abnormal Pap or HPV tests in the past 10 years.  Fecal occult blood test (FOBT) of stool. / Every year beginning at age 40 years and continuing until age 27 years. You may not need to do this test if you get a colonoscopy every 10 years.  Flexible sigmoidoscopy or colonoscopy.** / Every 5 years for a flexible sigmoidoscopy or every 10 years for a colonoscopy beginning at age 7 years and continuing until age 32 years.  Hepatitis C blood test.** / For all people born from [birth year] through 1965 and any individual with known risks for hepatitis C.  Osteoporosis screening.** / A one-time screening for women ages 30 years and over and women at risk for fractures or osteoporosis.  Skin self-exam. / Monthly.  Influenza vaccine. / Every year.  Tetanus, diphtheria, and acellular pertussis (Tdap/Td)  vaccine.** / 1 dose of Td every 10 years.  Varicella vaccine.** / Consult your health care provider.  Zoster vaccine.** / 1 dose for adults aged 35 years or older.  Pneumococcal 13-valent conjugate (PCV13) vaccine.** / Consult your health care provider.  Pneumococcal polysaccharide (PPSV23) vaccine.** / 1 dose for all adults aged 46 years and older.  Meningococcal vaccine.** / Consult your health care provider.  Hepatitis A vaccine.** / Consult your health care provider.  Hepatitis B vaccine.** / Consult your health care provider.  Haemophilus influenzae type b (Hib) vaccine.** / Consult your health care provider. ** Family history and personal history of risk and conditions may change your health care provider's recommendations.   This information is not intended to replace advice given to you by your health care provider. Make sure you discuss any questions you have with your health care provider.   Document Released: 08/28/2001 Document Revised: 07/23/2014 Document Reviewed: 11/27/2010 Elsevier Interactive Patient Education [employer].

## 2015-05-24 NOTE — Progress Notes (Signed)
Pre visit review using our clinic review tool, if applicable. No additional management support is needed unless otherwise documented below in the visit note. 

## 2015-06-03 ENCOUNTER — Ambulatory Visit (HOSPITAL_BASED_OUTPATIENT_CLINIC_OR_DEPARTMENT_OTHER)
Admission: RE | Admit: 2015-06-03 | Discharge: 2015-06-03 | Disposition: A | Discharge status: Home / Self Care | Payer: Medicare Other | Source: Ambulatory Visit | Attending: Family Medicine | Admitting: Family Medicine

## 2015-06-03 DIAGNOSIS — M810 Age-related osteoporosis without current pathological fracture: Secondary | ICD-10-CM | POA: Insufficient documentation

## 2015-06-03 DIAGNOSIS — Z780 Asymptomatic menopausal state: Secondary | ICD-10-CM | POA: Insufficient documentation

## 2015-06-05 ENCOUNTER — Encounter: Payer: Self-pay | Admitting: Family Medicine

## 2015-06-05 NOTE — Assessment & Plan Note (Signed)
Patient denies any difficulties at home. No trouble with ADLs, depression or falls. See EMR for functional status screen and depression screen. No recent changes to vision or hearing. Is UTD with immunizations. Is UTD with screening. Discussed Advanced Directives. Encouraged heart healthy diet, exercise as tolerated and adequate sleep. See patient's problem list for health risk factors to monitor. See AVS for preventative healthcare recommendation schedule. Dexa scan ordered. Prevnar given.

## 2015-06-05 NOTE — Assessment & Plan Note (Signed)
Maintain supplements and continue to monitor

## 2015-06-05 NOTE — Assessment & Plan Note (Signed)
Tolerating statin, encouraged heart healthy diet, avoid trans fats, minimize simple carbs and saturated fats. Increase exercise as tolerated 

## 2015-06-05 NOTE — Progress Notes (Signed)
Subjective:    Patient ID: Lori Bright, female    DOB: 01/21/1924, 81 y.o.   MRN: [id number]  Chief Complaint  Patient presents with  . Medicare Wellness    HPI Patient is in today for annual wellness exam. She continues to stay very active. She exercises daily. Performed type C, weightlifting and other exercises as well. Eats a heart healthy diet and avoids processed foods. Is doing well at home with her activities of daily living. She's had no recent illness or acute concerns but continues to struggle with fatigue. Denies CP/palp/SOB/HA/congestion/fevers/GI or GU c/o. Taking meds as prescribed  Past Medical History  Diagnosis Date  . Cervical cancer screening 04/03/2011  . Wrist pain, right 04/03/2011  . Anemia 11/28/2011  . Hyperlipidemia 11/28/2011  . Parotiditis 10/18/2012  . Annual physical exam 05/24/2013  . Urinary incontinence 11/22/2013  . Depression 04/25/2014  . Medicare annual wellness visit, subsequent 05/24/2013    Past Surgical History  Procedure Laterality Date  . Tonsillectomy    . Hemorrhoid surgery    . Cataract extraction      b/l    Family History  Problem Relation Age of Onset  . Hip fracture Mother 12  . Heart disease Mother     w/ mitral valve replacement  . Obesity Mother   . Cancer Father     hepatic/ cigar smoker  . Cancer Brother     esophageal/ smoker  . Glaucoma Brother   . Other Daughter     early hysterectomy for menometroraghia  . Deep vein thrombosis Son   . Hip fracture Maternal Grandmother   . Other Maternal Grandmother     bladder prolapse  . Hip fracture Paternal Grandfather   . Glaucoma Paternal Grandfather   . Deep vein thrombosis Son     Social History   Social History  . Marital Status: Married    Spouse Name: N/A  . Number of Children: N/A  . Years of Education: N/A   Occupational History  . Not on file.   Social History Main Topics  . Smoking status: Never Smoker   . Smokeless tobacco: Never Used  . Alcohol Use:  Not on file     Comment: rare  . Drug Use: No  . Sexual Activity: Not on file   Other Topics Concern  . Not on file   Social History Narrative    Outpatient Prescriptions Prior to Visit  Medication Sig Dispense Refill  . atorvastatin (LIPITOR) 10 MG tablet Take 1 tablet (10 mg total) by mouth daily. 90 tablet 2  . calcium carbonate (OS-CAL) 600 MG TABS Take 1,200 mg by mouth daily with breakfast.     . diltiazem (DILT-XR) 120 MG 24 hr capsule Take 1 capsule (120 mg total) by mouth daily. 90 capsule 2  . fish oil-omega-3 fatty acids 1000 MG capsule Take 1 g by mouth daily.     . valsartan (DIOVAN) 160 MG tablet TAKE 1 TABLET DAILY 90 tablet 1  . hydrochlorothiazide (MICROZIDE) 12.5 MG capsule Take 1 capsule (12.5 mg total) by mouth daily. 90 capsule    No facility-administered medications prior to visit.    No Known Allergies  Review of Systems  Constitutional: Positive for malaise/fatigue. Negative for fever and chills.  HENT: Negative for congestion and hearing loss.   Eyes: Negative for discharge.  Respiratory: Negative for cough, sputum production and shortness of breath.   Cardiovascular: Negative for chest pain, palpitations and leg swelling.  Gastrointestinal: Negative for  heartburn, nausea, vomiting, abdominal pain, diarrhea, constipation and blood in stool.  Genitourinary: Negative for dysuria, urgency, frequency and hematuria.  Musculoskeletal: Positive for joint pain. Negative for myalgias, back pain and falls.  Skin: Negative for rash.  Neurological: Negative for dizziness, sensory change, loss of consciousness, weakness and headaches.  Endo/Heme/Allergies: Negative for environmental allergies. Does not bruise/bleed easily.  Psychiatric/Behavioral: Negative for depression and suicidal ideas. The patient is not nervous/anxious and does not have insomnia.        Objective:    Physical Exam  Constitutional: She is oriented to person, place, and time. She appears  well-developed and well-nourished. No distress.  HENT:  Head: Normocephalic and atraumatic.  Eyes: Conjunctivae are normal.  Neck: Neck supple. No thyromegaly present.  Cardiovascular: Normal rate, regular rhythm and normal heart sounds.   No murmur heard. Pulmonary/Chest: Effort normal and breath sounds normal. No respiratory distress.  Abdominal: Soft. Bowel sounds are normal. She exhibits no distension and no mass. There is no tenderness.  Musculoskeletal: She exhibits no edema.  Lymphadenopathy:    She has no cervical adenopathy.  Neurological: She is alert and oriented to person, place, and time.  Skin: Skin is warm and dry.  Psychiatric: She has a normal mood and affect. Her behavior is normal.    BP 132/84 mmHg  Pulse 81  Temp(Src) 98.3 F (36.8 C) (Oral)  Ht 5\' 5"  (1.651 m)  Wt 137 lb 6 oz (62.313 kg)  BMI 22.86 kg/m2  SpO2 98% Wt Readings from Last 3 Encounters:  05/24/15 137 lb 6 oz (62.313 kg)  10/22/14 134 lb (60.782 kg)  04/22/14 136 lb 6.4 oz (61.871 kg)     Lab Results  Component Value Date   WBC 7.1 05/24/2015   HGB 12.8 05/24/2015   HCT 40.2 05/24/2015   PLT 194.0 05/24/2015   GLUCOSE 98 05/24/2015   CHOL 141 05/24/2015   TRIG 107.0 05/24/2015   HDL 48.40 05/24/2015   LDLCALC 71 05/24/2015   ALT 17 05/24/2015   AST 19 05/24/2015   NA 142 05/24/2015   K 3.6 05/24/2015   CL 105 05/24/2015   CREATININE 0.69 05/24/2015   BUN 21 05/24/2015   CO2 29 05/24/2015   TSH 1.85 05/24/2015    Lab Results  Component Value Date   TSH 1.85 05/24/2015   Lab Results  Component Value Date   WBC 7.1 05/24/2015   HGB 12.8 05/24/2015   HCT 40.2 05/24/2015   MCV 90.9 05/24/2015   PLT 194.0 05/24/2015   Lab Results  Component Value Date   NA 142 05/24/2015   K 3.6 05/24/2015   CO2 29 05/24/2015   GLUCOSE 98 05/24/2015   BUN 21 05/24/2015   CREATININE 0.69 05/24/2015   BILITOT 0.5 05/24/2015   ALKPHOS 56 05/24/2015   AST 19 05/24/2015   ALT 17  05/24/2015   PROT 7.1 05/24/2015   ALBUMIN 4.3 05/24/2015   CALCIUM 10.0 05/24/2015   GFR 85.04 05/24/2015   Lab Results  Component Value Date   CHOL 141 05/24/2015   Lab Results  Component Value Date   HDL 48.40 05/24/2015   Lab Results  Component Value Date   LDLCALC 71 05/24/2015   Lab Results  Component Value Date   TRIG 107.0 05/24/2015   Lab Results  Component Value Date   CHOLHDL 3 05/24/2015   No results found for: HGBA1C     Assessment & Plan:   Problem List Items Addressed This Visit  ESSENTIAL HYPERTENSION, BENIGN    Improved on recheck, patient already eats well and exercises regularly. She reports running in the 140s elsewhere, will start HCTZ 12.5 mg daily and may use prn. Continue other meds.      Relevant Medications   hydrochlorothiazide (MICROZIDE) 12.5 MG capsule   Medicare annual wellness visit, subsequent    Patient denies any difficulties at home. No trouble with ADLs, depression or falls. See EMR for functional status screen and depression screen. No recent changes to vision or hearing. Is UTD with immunizations. Is UTD with screening. Discussed Advanced Directives. Encouraged heart healthy diet, exercise as tolerated and adequate sleep. See patient's problem list for health risk factors to monitor. See AVS for preventative healthcare recommendation schedule. Dexa scan ordered. Prevnar given.       MIXED HYPERLIPIDEMIA    Tolerating statin, encouraged heart healthy diet, avoid trans fats, minimize simple carbs and saturated fats. Increase exercise as tolerated      Relevant Medications   hydrochlorothiazide (MICROZIDE) 12.5 MG capsule   Osteoporosis    Stays very active continue calcium and vitamin D intake, check Dexa scan      Relevant Orders   Vitamin D (25 hydroxy) (Completed)   DG Bone Density (Completed)   Vitamin D deficiency    Maintain supplements and continue to monitor       Other Visit Diagnoses    Essential  hypertension    -  Primary    Relevant Medications    hydrochlorothiazide (MICROZIDE) 12.5 MG capsule    Other Relevant Orders    TSH (Completed)    Lipid panel (Completed)    Comprehensive metabolic panel (Completed)    CBC (Completed)    Hyperlipidemia, mixed        Relevant Medications    hydrochlorothiazide (MICROZIDE) 12.5 MG capsule    Other Relevant Orders    TSH (Completed)    Lipid panel (Completed)    Comprehensive metabolic panel (Completed)    CBC (Completed)    Need for 23-polyvalent pneumococcal polysaccharide vaccine        Relevant Orders    Pneumococcal polysaccharide vaccine 23-valent greater than or equal to 2yo subcutaneous/IM (Completed)       I have discontinued Ms. Jeppsen's hydrochlorothiazide. I am also having her start on hydrochlorothiazide. Additionally, I am having her maintain her fish oil-omega-3 fatty acids, calcium carbonate, diltiazem, atorvastatin, and valsartan.  Meds ordered this encounter  Medications  . hydrochlorothiazide (MICROZIDE) 12.5 MG capsule    Sig: Take 1 capsule (12.5 mg total) by mouth daily. OK to substitute tablet    Dispense:  90 capsule    Refill:  3     Penni Homans, MD

## 2015-06-05 NOTE — Assessment & Plan Note (Signed)
Stays very active continue calcium and vitamin D intake, check Dexa scan

## 2015-06-05 NOTE — Assessment & Plan Note (Signed)
Improved on recheck, patient already eats well and exercises regularly. She reports running in the 140s elsewhere, will start HCTZ 12.5 mg daily and may use prn. Continue other meds.

## 2015-06-06 ENCOUNTER — Telehealth: Payer: Self-pay | Admitting: Family Medicine

## 2015-06-06 NOTE — Telephone Encounter (Signed)
Patient did not need a phone call.  Appointment scheduled already.

## 2015-06-06 NOTE — Telephone Encounter (Signed)
Spoke to the patient and she has a lump on the side of her face that needs an MD to check out. Transferred to a scheduler to schedule acute appointment

## 2015-06-06 NOTE — Telephone Encounter (Signed)
Received transfer from [place] for acute appt. Pt refused to see PA or NP. Pt is scheduled for 06/07/15 8:45am with Dr. Larose Kells as Dr. Charlett Blake has no opening.

## 2015-06-06 NOTE — Telephone Encounter (Signed)
Pt lvm at 8:43. She says that she need to speak with CMA directly.   CB:# [phone]

## 2015-06-06 NOTE — Telephone Encounter (Signed)
Caller name: Self   Can be reached: [phone]   Reason for call: Patient has important question to ask Dr. Charlett Blake. She is also activating her My Chart so that she can use it

## 2015-06-07 ENCOUNTER — Encounter: Payer: Self-pay | Admitting: Internal Medicine

## 2015-06-07 ENCOUNTER — Ambulatory Visit (INDEPENDENT_AMBULATORY_CARE_PROVIDER_SITE_OTHER): Payer: Medicare Other | Admitting: Internal Medicine

## 2015-06-07 ENCOUNTER — Ambulatory Visit (HOSPITAL_BASED_OUTPATIENT_CLINIC_OR_DEPARTMENT_OTHER)
Admission: RE | Admit: 2015-06-07 | Discharge: 2015-06-07 | Disposition: A | Discharge status: Home / Self Care | Payer: Medicare Other | Source: Ambulatory Visit | Attending: Internal Medicine | Admitting: Internal Medicine

## 2015-06-07 VITALS — BP 124/68 | HR 88 | Temp 97.8°F | Ht 65.0 in | Wt 137.2 lb

## 2015-06-07 DIAGNOSIS — R930 Abnormal findings on diagnostic imaging of skull and head, not elsewhere classified: Secondary | ICD-10-CM | POA: Insufficient documentation

## 2015-06-07 DIAGNOSIS — I10 Essential (primary) hypertension: Secondary | ICD-10-CM | POA: Diagnosis not present

## 2015-06-07 DIAGNOSIS — R220 Localized swelling, mass and lump, head: Secondary | ICD-10-CM | POA: Diagnosis not present

## 2015-06-07 LAB — BASIC METABOLIC PANEL
BUN: 29 mg/dL — AB (ref 6–23)
CHLORIDE: 105 meq/L (ref 96–112)
CO2: 30 meq/L (ref 19–32)
CREATININE: 0.76 mg/dL (ref 0.40–1.20)
Calcium: 10 mg/dL (ref 8.4–10.5)
GFR: 76.06 mL/min (ref >60.00)
Glucose, Bld: 88 mg/dL (ref 70–99)
POTASSIUM: 3.6 meq/L (ref 3.5–5.1)
Sodium: 143 mEq/L (ref 135–145)

## 2015-06-07 NOTE — Progress Notes (Signed)
Pre visit review using our clinic review tool, if applicable. No additional management support is needed unless otherwise documented below in the visit note. 

## 2015-06-07 NOTE — Progress Notes (Signed)
Subjective:    Patient ID: Lori Bright, female    DOB: March 23, 1924, 81 y.o.   MRN: [id number]  DOS:  06/07/2015 Type of visit - description : Acute Interval history: Few days ago, her hairdresser noted a bump on the right side of the head. Patient was not aware of it. Denies any pain. Also, recently HCTZ was added, she is taking it daily   Review of Systems No recent injury. No decreased hearing or tinnitus No visual disturbances.   Past Medical History  Diagnosis Date  . Cervical cancer screening 04/03/2011  . Wrist pain, right 04/03/2011  . Anemia 11/28/2011  . Hyperlipidemia 11/28/2011  . Parotiditis 10/18/2012  . Annual physical exam 05/24/2013  . Urinary incontinence 11/22/2013  . Depression 04/25/2014  . Medicare annual wellness visit, subsequent 05/24/2013    Past Surgical History  Procedure Laterality Date  . Tonsillectomy    . Hemorrhoid surgery    . Cataract extraction      b/l    Social History   Social History  . Marital Status: Married    Spouse Name: N/A  . Number of Children: N/A  . Years of Education: N/A   Occupational History  . Not on file.   Social History Main Topics  . Smoking status: Never Smoker   . Smokeless tobacco: Never Used  . Alcohol Use: Not on file     Comment: rare  . Drug Use: No  . Sexual Activity: Not on file   Other Topics Concern  . Not on file   Social History Narrative        Medication List       This list is accurate as of: 06/07/15  7:39 PM.  Always use your most recent med list.               atorvastatin 10 MG tablet  Commonly known as:  LIPITOR  Take 1 tablet (10 mg total) by mouth daily.     calcium carbonate 600 MG Tabs tablet  Commonly known as:  OS-CAL  Take 1,200 mg by mouth daily with breakfast.     diltiazem 120 MG 24 hr capsule  Commonly known as:  DILT-XR  Take 1 capsule (120 mg total) by mouth daily.     fish oil-omega-3 fatty acids 1000 MG capsule  Take 1 g by mouth daily.     hydrochlorothiazide 12.5 MG capsule  Commonly known as:  MICROZIDE  Take 1 capsule (12.5 mg total) by mouth daily. OK to substitute tablet     valsartan 160 MG tablet  Commonly known as:  DIOVAN  TAKE 1 TABLET DAILY           Objective:   Physical Exam  HENT:  Head:     BP 124/68 mmHg  Pulse 88  Temp(Src) 97.8 F (36.6 C) (Oral)  Ht 5\' 5"  (1.651 m)  Wt 137 lb 4 oz (62.256 kg)  BMI 22.84 kg/m2  SpO2 98% General:   Well developed, well nourished . NAD.  HEENT:  Normocephalic . Face symmetric, atraumatic. EOMI  Skin: Not pale. Not jaundice Neurologic:  alert & oriented X3.  Speech normal, gait appropriate for age and unassisted Psych--  Cognition and judgment appear intact.  Cooperative with normal attention span and concentration.  Behavior appropriate. No anxious or depressed appearing.      Assessment & Plan:   Firm mass at the head: Unclear etiology, will get a CT refer to ENT Addendum: CT abnormal, will  ds ENT HTN: Was prescribed hydrochlorothiazide for when necessary use but she is taking it daily. Check a BMP.

## 2015-06-07 NOTE — Patient Instructions (Signed)
Go to the lab and get blood work today  Will  schedule a CT of the area  Will refer you to ENT, a specialist

## 2015-06-08 ENCOUNTER — Telehealth: Payer: Self-pay | Admitting: Family Medicine

## 2015-06-08 NOTE — Telephone Encounter (Signed)
Pt calling to get CT results. She said she has specialist appt in 1 1/2 weeks and she needs to know if there is more going on. She is requesting call from [facility].

## 2015-06-08 NOTE — Telephone Encounter (Signed)
FYI. Please advise.

## 2015-06-08 NOTE — Telephone Encounter (Signed)
I spoke with the patient, see CT  report for documentation of the call

## 2015-06-13 NOTE — Telephone Encounter (Addendum)
I spoke with neurosurgery regards CT findings >>> DDX includes meningioma versus Paget's disease. Given advanced age aggressive intervention is most likely not warranted. If patient is requesting further evaluation, then a MRI with and without contrast could be ordered to rule out a meningioma . If that is negative then the most likely etiology is Pagets disease. Above was discussed with the patient's daughter Lori Bright who is a pediatrician [phone]) and we agree she will relay this information to her mother. Pt has a pending appointment to see ENT. Also recommend to get a visit to see her PCP for further discussion.

## 2015-09-07 ENCOUNTER — Other Ambulatory Visit: Payer: Self-pay | Admitting: Family Medicine

## 2015-09-07 NOTE — Telephone Encounter (Signed)
Refilled patients rx request for #[phone] with 0 rf.

## 2015-09-16 ENCOUNTER — Other Ambulatory Visit: Payer: Self-pay | Admitting: Family Medicine

## 2015-09-28 ENCOUNTER — Other Ambulatory Visit: Payer: Self-pay | Admitting: Family Medicine

## 2015-10-31 LAB — HM MAMMOGRAPHY

## 2015-11-01 ENCOUNTER — Telehealth: Payer: Self-pay | Admitting: *Deleted

## 2015-11-01 NOTE — Telephone Encounter (Signed)
Received Physician Order; forwarded to provider/SLS 04/18

## 2015-11-03 ENCOUNTER — Encounter: Payer: Self-pay | Admitting: Family Medicine

## 2015-11-04 ENCOUNTER — Encounter: Payer: Self-pay | Admitting: Family Medicine

## 2016-01-06 ENCOUNTER — Other Ambulatory Visit: Payer: Self-pay | Admitting: Family Medicine

## 2016-01-10 ENCOUNTER — Ambulatory Visit (INDEPENDENT_AMBULATORY_CARE_PROVIDER_SITE_OTHER): Payer: Medicare Other | Admitting: Family Medicine

## 2016-01-10 ENCOUNTER — Encounter: Payer: Self-pay | Admitting: Family Medicine

## 2016-01-10 VITALS — BP 138/70 | HR 93 | Temp 98.2°F | Ht 65.0 in | Wt 137.4 lb

## 2016-01-10 DIAGNOSIS — H7491 Unspecified disorder of right middle ear and mastoid: Secondary | ICD-10-CM

## 2016-01-10 DIAGNOSIS — R220 Localized swelling, mass and lump, head: Secondary | ICD-10-CM | POA: Diagnosis not present

## 2016-01-10 DIAGNOSIS — I10 Essential (primary) hypertension: Secondary | ICD-10-CM

## 2016-01-10 DIAGNOSIS — G370 Diffuse sclerosis of central nervous system: Secondary | ICD-10-CM

## 2016-01-10 DIAGNOSIS — M810 Age-related osteoporosis without current pathological fracture: Secondary | ICD-10-CM | POA: Diagnosis not present

## 2016-01-10 DIAGNOSIS — E782 Mixed hyperlipidemia: Secondary | ICD-10-CM

## 2016-01-10 DIAGNOSIS — E559 Vitamin D deficiency, unspecified: Secondary | ICD-10-CM

## 2016-01-10 MED ORDER — HYDROCHLOROTHIAZIDE 12.5 MG PO TABS: 12.5000 mg | ORAL_TABLET | Freq: Every day | ORAL | Status: DC

## 2016-01-10 NOTE — Patient Instructions (Signed)
Hypertension Hypertension, commonly called high blood pressure, is when the force of blood pumping through your arteries is too strong. Your arteries are the blood vessels that carry blood from your heart throughout your body. A blood pressure reading consists of a higher number over a lower number, such as 110/72. The higher number (systolic) is the pressure inside your arteries when your heart pumps. The lower number (diastolic) is the pressure inside your arteries when your heart relaxes. Ideally you want your blood pressure below 120/80. Hypertension forces your heart to work harder to pump blood. Your arteries may become narrow or stiff. Having untreated or uncontrolled hypertension can cause heart attack, stroke, kidney disease, and other problems. RISK FACTORS Some risk factors for high blood pressure are controllable. Others are not.  Risk factors you cannot control include:   Race. You may be at higher risk if you are African American.  Age. Risk increases with age.  Gender. Men are at higher risk than women before age 45 years. After age 65, women are at higher risk than men. Risk factors you can control include:  Not getting enough exercise or physical activity.  Being overweight.  Getting too much fat, sugar, calories, or salt in your diet.  Drinking too much alcohol. SIGNS AND SYMPTOMS Hypertension does not usually cause signs or symptoms. Extremely high blood pressure (hypertensive crisis) may cause headache, anxiety, shortness of breath, and nosebleed. DIAGNOSIS To check if you have hypertension, your health care provider will measure your blood pressure while you are seated, with your arm held at the level of your heart. It should be measured at least twice using the same arm. Certain conditions can cause a difference in blood pressure between your right and left arms. A blood pressure reading that is higher than normal on one occasion does not mean that you need treatment. If  it is not clear whether you have high blood pressure, you may be asked to return on a different day to have your blood pressure checked again. Or, you may be asked to monitor your blood pressure at home for 1 or more weeks. TREATMENT Treating high blood pressure includes making lifestyle changes and possibly taking medicine. Living a healthy lifestyle can help lower high blood pressure. You may need to change some of your habits. Lifestyle changes may include:  Following the DASH diet. This diet is high in fruits, vegetables, and whole grains. It is low in salt, red meat, and added sugars.  Keep your sodium intake below 2,300 mg per day.  Getting at least 30-45 minutes of aerobic exercise at least 4 times per week.  Losing weight if necessary.  Not smoking.  Limiting alcoholic beverages.  Learning ways to reduce stress. Your health care provider may prescribe medicine if lifestyle changes are not enough to get your blood pressure under control, and if one of the following is true:  You are 18-59 years of age and your systolic blood pressure is above 140.  You are 60 years of age or older, and your systolic blood pressure is above 150.  Your diastolic blood pressure is above 90.  You have diabetes, and your systolic blood pressure is over 140 or your diastolic blood pressure is over 90.  You have kidney disease and your blood pressure is above 140/90.  You have heart disease and your blood pressure is above 140/90. Your personal target blood pressure may vary depending on your medical conditions, your age, and other factors. HOME CARE INSTRUCTIONS    Have your blood pressure rechecked as directed by your health care provider.   Take medicines only as directed by your health care provider. Follow the directions carefully. Blood pressure medicines must be taken as prescribed. The medicine does not work as well when you skip doses. Skipping doses also puts you at risk for  problems.  Do not smoke.   Monitor your blood pressure at home as directed by your health care provider. SEEK MEDICAL CARE IF:   You think you are having a reaction to medicines taken.  You have recurrent headaches or feel dizzy.  You have swelling in your ankles.  You have trouble with your vision. SEEK IMMEDIATE MEDICAL CARE IF:  You develop a severe headache or confusion.  You have unusual weakness, numbness, or feel faint.  You have severe chest or abdominal pain.  You vomit repeatedly.  You have trouble breathing. MAKE SURE YOU:   Understand these instructions.  Will watch your condition.  Will get help right away if you are not doing well or get worse.   This information is not intended to replace advice given to you by your health care provider. Make sure you discuss any questions you have with your health care provider.   Document Released: 07/02/2005 Document Revised: 11/16/2014 Document Reviewed: 04/24/2013 Elsevier Interactive Patient Education 2016 [employer] Inc[employer]  

## 2016-01-11 LAB — CBC
HCT: 37.5 % (ref 36.0–46.0)
Hemoglobin: 12.4 g/dL (ref 12.0–15.0)
MCHC: 33 g/dL (ref 30.0–36.0)
MCV: 89.4 fl (ref 78.0–100.0)
Platelets: 226 10*3/uL (ref 150.0–400.0)
RBC: 4.19 Mil/uL (ref 3.87–5.11)
RDW: 14.9 % (ref 11.5–15.5)
WBC: 7.4 10*3/uL (ref 4.0–10.5)

## 2016-01-11 LAB — LIPID PANEL
CHOL/HDL RATIO: 3
CHOLESTEROL: 133 mg/dL (ref 0–200)
HDL: 44.1 mg/dL (ref >39.00)
LDL CALC: 63 mg/dL (ref 0–99)
NONHDL: 88.85
Triglycerides: 128 mg/dL (ref 0.0–149.0)
VLDL: 25.6 mg/dL (ref 0.0–40.0)

## 2016-01-11 LAB — COMPREHENSIVE METABOLIC PANEL
ALBUMIN: 4.3 g/dL (ref 3.5–5.2)
ALK PHOS: 47 U/L (ref 39–117)
ALT: 18 U/L (ref 0–35)
AST: 16 U/L (ref 0–37)
BILIRUBIN TOTAL: 0.3 mg/dL (ref 0.2–1.2)
BUN: 30 mg/dL — AB (ref 6–23)
CO2: 31 mEq/L (ref 19–32)
Calcium: 10.1 mg/dL (ref 8.4–10.5)
Chloride: 106 mEq/L (ref 96–112)
Creatinine, Ser: 0.78 mg/dL (ref 0.40–1.20)
GFR: 73.72 mL/min (ref >60.00)
GLUCOSE: 113 mg/dL — AB (ref 70–99)
Potassium: 4.5 mEq/L (ref 3.5–5.1)
Sodium: 141 mEq/L (ref 135–145)
TOTAL PROTEIN: 7.1 g/dL (ref 6.0–8.3)

## 2016-01-11 LAB — VITAMIN D 25 HYDROXY (VIT D DEFICIENCY, FRACTURES): VITD: 37.66 ng/mL (ref 30.00–100.00)

## 2016-01-11 LAB — VITAMIN B12

## 2016-01-13 ENCOUNTER — Other Ambulatory Visit: Payer: Self-pay | Admitting: Family Medicine

## 2016-01-13 ENCOUNTER — Ambulatory Visit
Admission: RE | Admit: 2016-01-13 | Discharge: 2016-01-13 | Disposition: A | Discharge status: Home / Self Care | Payer: Medicare Other | Source: Ambulatory Visit | Attending: Family Medicine | Admitting: Family Medicine

## 2016-01-13 ENCOUNTER — Telehealth: Payer: Self-pay | Admitting: Family Medicine

## 2016-01-13 DIAGNOSIS — R220 Localized swelling, mass and lump, head: Secondary | ICD-10-CM

## 2016-01-13 DIAGNOSIS — G370 Diffuse sclerosis of central nervous system: Secondary | ICD-10-CM

## 2016-01-13 DIAGNOSIS — H7491 Unspecified disorder of right middle ear and mastoid: Secondary | ICD-10-CM

## 2016-01-13 MED ORDER — GADOBENATE DIMEGLUMINE 529 MG/ML IV SOLN
13.0000 mL | Freq: Once | INTRAVENOUS | Status: AC | PRN
  Administered 2016-01-13: 13 mL via INTRAVENOUS

## 2016-01-13 NOTE — Telephone Encounter (Signed)
I am on the on call provider this evening and I received a call with report of an abnormal CT on Lori Bright. The mass described appears to have enlarged from prior image, along with new midline shift and vasogenic edema. I was spoke with the patient this evening to attempt to gather specifics surrounding this order, and she states she is not having any symptoms, and the MRI was ordered because she noticed some swelling around her temple. Patient denies headache, dizziness, nausea, vomit, or other neurological manifestations.   Briefly discussed with patient the findings of her MRI show the mass is enlarging and causing changes inside her brain. I encouraged her to seek care in the ED immediately if she experiences any changes over the weekend, even if just mild. I will forward a copy of this interaction to her PCP Dr. Charlett Bright and pt asked me to forward to Dr. Wilburn Bright as well (her ENT). I encouraged pt to call the office on Monday to discuss with her PCP if she does not hear from her sooner.   [name] Wo Contrast  01/13/2016  CLINICAL DATA:  82 year old female with swelling right-side of face and head. Subsequent encounter. EXAM: MRI HEAD WITHOUT AND WITH CONTRAST TECHNIQUE: Multiplanar, multiecho pulse sequences of the brain and surrounding structures were obtained without and with intravenous contrast. CONTRAST:  76mL MULTIHANCE GADOBENATE DIMEGLUMINE 529 MG/ML IV SOLN COMPARISON:  06/07/2015 temporal bone CT. FINDINGS: Mass centered in the right temporal bone causing sclerosis of the right temporal bone extending into the sphenoid and parietal bone. This mass has extension into the right temporalis muscle and intracranial extension compressing the lateral aspect of the right temporal lobe with underlying vasogenic edema. The epicenter of the mass measures 6.2 x 3.7 x 3.9 cm. Diffuse dural enhancement throughout the right hemisphere. Mass effect upon the right lateral ventricle with 2 mm midline shift to the  left. This may represent an intraosseous meningioma with soft tissue and intracranial extension however, other considerations such as that secondary to metastatic disease, hemangiopericytoma or lymphoma are also possibility. It is difficult to make direct comparison to the prior examination CT although I suspect the mass has enlarged. No acute infarct or intracranial hemorrhage. Prominent chronic microvascular changes. Left cerebellar developmental venous anomaly incidentally noted. Major intracranial vascular structures are patent. Mild mucosal thickening ethmoid sinus air cells and left maxillary sinus. Opacification right mastoid air cells without obstructing lesion of the eustachian tube noted. IMPRESSION: Mass centered in the right temporal bone causing sclerosis of the right temporal bone extending into the sphenoid and parietal bone. This mass has extension into the right temporalis muscle and intracranial extension compressing the lateral aspect of the right temporal lobe with underlying vasogenic edema. The epicenter of the mass measures 6.2 x 3.7 x 3.9 cm. Diffuse dural enhancement throughout the right hemisphere. Mass effect upon the right lateral ventricle with 2 mm midline shift to the left. This may represent an intraosseous meningioma with soft tissue and intracranial extension however, other considerations such as that secondary to metastatic disease, hemangiopericytoma or lymphoma cannot be excluded. It is difficult to make direct comparison to the prior examination CT although I suspect the mass has enlarged. Prominent chronic microvascular changes. Left cerebellar developmental venous anomaly incidentally noted. Opacification right mastoid air cells. These results will be called to the ordering clinician or representative by the Radiologist Assistant, and communication documented in the PACS or zVision Dashboard. Electronically Signed   By: Alcide Evener.D.  On: 01/13/2016 19:14    Electronically Signed by: Howard Pouch, DO [facility]

## 2016-01-15 DIAGNOSIS — H7490 Unspecified disorder of middle ear and mastoid, unspecified ear: Secondary | ICD-10-CM | POA: Insufficient documentation

## 2016-01-15 DIAGNOSIS — G370 Diffuse sclerosis of central nervous system: Secondary | ICD-10-CM | POA: Insufficient documentation

## 2016-01-15 NOTE — Assessment & Plan Note (Signed)
Well controlled, no changes to meds. Encouraged heart healthy diet such as the DASH diet and exercise as tolerated.  °

## 2016-01-15 NOTE — Assessment & Plan Note (Signed)
Tolerating statin, encouraged heart healthy diet, avoid trans fats, minimize simple carbs and saturated fats. Increase exercise as tolerated 

## 2016-01-15 NOTE — Assessment & Plan Note (Signed)
Has been having worsening swelling on right side of her face but no pain or associated symptoms. MRI is ordered ans shows  Mass centered in the right temporal bone causing sclerosis of the right temporal bone extending into the sphenoid and parietal bone. This mass has extension into the right temporalis muscle and intracranial extension compressing the lateral aspect of the right temporal lobe with underlying vasogenic edema. The epicenter of the mass measures 6.2 x 3.7 x 3.9 cm. Diffuse dural enhancement throughout the right hemisphere. Mass effect upon the right lateral ventricle with 2 mm midline shift to the left. Will refer to neurosurgery urgently for further consideration.

## 2016-01-15 NOTE — Progress Notes (Signed)
Patient ID: Lori Bright, female   DOB: [birth year], 82 y.o.   MRN: [id number]   Subjective:    Patient ID: Lori Bright, female    DOB: Nov 26, 1923, 82 y.o.   MRN: [id number]  Chief Complaint  Patient presents with  . Follow-up    6 month    HPI Patient is in today for follow up. She feels well today but she is continuing to observe increased swelling in the right side of her face. She denies any pain, headache or neurologic concerns. Denies CP/palp/SOB/HA/congestion/fevers/GI or GU c/o. Taking meds as prescribed  Past Medical History  Diagnosis Date  . Cervical cancer screening 04/03/2011  . Wrist pain, right 04/03/2011  . Anemia 11/28/2011  . Hyperlipidemia 11/28/2011  . Parotiditis 10/18/2012  . Annual physical exam 05/24/2013  . Urinary incontinence 11/22/2013  . Depression 04/25/2014  . Medicare annual wellness visit, subsequent 05/24/2013    Past Surgical History  Procedure Laterality Date  . Tonsillectomy    . Hemorrhoid surgery    . Cataract extraction      b/l    Family History  Problem Relation Age of Onset  . Hip fracture Mother 49  . Heart disease Mother     w/ mitral valve replacement  . Obesity Mother   . Cancer Father     hepatic/ cigar smoker  . Cancer Brother     esophageal/ smoker  . Glaucoma Brother   . Other Daughter     early hysterectomy for menometroraghia  . Deep vein thrombosis Son   . Hip fracture Maternal Grandmother   . Other Maternal Grandmother     bladder prolapse  . Hip fracture Paternal Grandfather   . Glaucoma Paternal Grandfather   . Deep vein thrombosis Son     Social History   Social History  . Marital Status: Married    Spouse Name: N/A  . Number of Children: N/A  . Years of Education: N/A   Occupational History  . Not on file.   Social History Main Topics  . Smoking status: Never Smoker   . Smokeless tobacco: Never Used  . Alcohol Use: Not on file     Comment: rare  . Drug Use: No  . Sexual Activity: Not on file   Other  Topics Concern  . Not on file   Social History Narrative    Outpatient Prescriptions Prior to Visit  Medication Sig Dispense Refill  . atorvastatin (LIPITOR) 10 MG tablet TAKE 1 TABLET DAILY 90 tablet 1  . calcium carbonate (OS-CAL) 600 MG TABS Take 1,200 mg by mouth daily with breakfast.     . DILT-XR 120 MG 24 hr capsule TAKE 1 CAPSULE DAILY 90 capsule 1  . fish oil-omega-3 fatty acids 1000 MG capsule Take 1 g by mouth daily.     . valsartan (DIOVAN) 160 MG tablet TAKE 1 TABLET DAILY 90 tablet 0  . hydrochlorothiazide (MICROZIDE) 12.5 MG capsule Take 1 capsule (12.5 mg total) by mouth daily. OK to substitute tablet 90 capsule 3   No facility-administered medications prior to visit.    No Known Allergies  Review of Systems  Constitutional: Positive for malaise/fatigue. Negative for fever.  HENT: Negative for congestion.   Eyes: Negative for blurred vision.  Respiratory: Negative for shortness of breath.   Cardiovascular: Negative for chest pain, palpitations and leg swelling.  Gastrointestinal: Negative for nausea, abdominal pain and blood in stool.  Genitourinary: Negative for dysuria and frequency.  Musculoskeletal: Negative for falls.  Skin:  Negative for rash.  Neurological: Negative for dizziness, loss of consciousness and headaches.  Endo/Heme/Allergies: Negative for environmental allergies.  Psychiatric/Behavioral: Negative for depression. The patient is not nervous/anxious.        Objective:    Physical Exam  Constitutional: She is oriented to person, place, and time. She appears well-developed and well-nourished. No distress.  HENT:  Head: Normocephalic and atraumatic.  Nose: Nose normal.  Right facial swelling noted.   Eyes: Right eye exhibits no discharge. Left eye exhibits no discharge.  Neck: Normal range of motion. Neck supple.  Cardiovascular: Normal rate and regular rhythm.   No murmur heard. Pulmonary/Chest: Effort normal and breath sounds normal.    Abdominal: Soft. Bowel sounds are normal. There is no tenderness.  Musculoskeletal: She exhibits no edema.  Neurological: She is alert and oriented to person, place, and time.  Skin: Skin is warm and dry.  Psychiatric: She has a normal mood and affect.  Nursing note and vitals reviewed.   BP 138/70 mmHg  Pulse 93  Temp(Src) 98.2 F (36.8 C) (Oral)  Ht 5\' 5"  (1.651 m)  Wt 137 lb 6 oz (62.313 kg)  BMI 22.86 kg/m2  SpO2 97% Wt Readings from Last 3 Encounters:  01/10/16 137 lb 6 oz (62.313 kg)  06/07/15 137 lb 4 oz (62.256 kg)  05/24/15 137 lb 6 oz (62.313 kg)     Lab Results  Component Value Date   WBC 7.4 01/10/2016   HGB 12.4 01/10/2016   HCT 37.5 01/10/2016   PLT 226.0 01/10/2016   GLUCOSE 113* 01/10/2016   CHOL 133 01/10/2016   TRIG 128.0 01/10/2016   HDL 44.10 01/10/2016   LDLCALC 63 01/10/2016   ALT 18 01/10/2016   AST 16 01/10/2016   NA 141 01/10/2016   K 4.5 01/10/2016   CL 106 01/10/2016   CREATININE 0.78 01/10/2016   BUN 30* 01/10/2016   CO2 31 01/10/2016   TSH 1.85 05/24/2015    Lab Results  Component Value Date   TSH 1.85 05/24/2015   Lab Results  Component Value Date   WBC 7.4 01/10/2016   HGB 12.4 01/10/2016   HCT 37.5 01/10/2016   MCV 89.4 01/10/2016   PLT 226.0 01/10/2016   Lab Results  Component Value Date   NA 141 01/10/2016   K 4.5 01/10/2016   CO2 31 01/10/2016   GLUCOSE 113* 01/10/2016   BUN 30* 01/10/2016   CREATININE 0.78 01/10/2016   BILITOT 0.3 01/10/2016   ALKPHOS 47 01/10/2016   AST 16 01/10/2016   ALT 18 01/10/2016   PROT 7.1 01/10/2016   ALBUMIN 4.3 01/10/2016   CALCIUM 10.1 01/10/2016   GFR 73.72 01/10/2016   Lab Results  Component Value Date   CHOL 133 01/10/2016   Lab Results  Component Value Date   HDL 44.10 01/10/2016   Lab Results  Component Value Date   LDLCALC 63 01/10/2016   Lab Results  Component Value Date   TRIG 128.0 01/10/2016   Lab Results  Component Value Date   CHOLHDL 3 01/10/2016    No results found for: HGBA1C     Assessment & Plan:   Problem List Items Addressed This Visit    Vitamin D deficiency    Encouraged daily supplements      MIXED HYPERLIPIDEMIA    Tolerating statin, encouraged heart healthy diet, avoid trans fats, minimize simple carbs and saturated fats. Increase exercise as tolerated      Relevant Medications   hydrochlorothiazide (HYDRODIURIL) 12.5  MG tablet   ESSENTIAL HYPERTENSION, BENIGN    Well controlled, no changes to meds. Encouraged heart healthy diet such as the DASH diet and exercise as tolerated.       Relevant Medications   hydrochlorothiazide (HYDRODIURIL) 12.5 MG tablet   Osteoporosis    Encouraged to get adequate exercise, calcium and vitamin d intake      Relevant Orders   Vitamin B12 (Completed)   Comprehensive metabolic panel (Completed)   Lipid panel (Completed)   CBC (Completed)   VITAMIN D 25 Hydroxy (Vit-D Deficiency, Fractures) (Completed)   Sclerosis, diffuse (HCC)    Has been having worsening swelling on right side of her face but no pain or associated symptoms. MRI is ordered ans shows  Mass centered in the right temporal bone causing sclerosis of the right temporal bone extending into the sphenoid and parietal bone. This mass has extension into the right temporalis muscle and intracranial extension compressing the lateral aspect of the right temporal lobe with underlying vasogenic edema. The epicenter of the mass measures 6.2 x 3.7 x 3.9 cm. Diffuse dural enhancement throughout the right hemisphere. Mass effect upon the right lateral ventricle with 2 mm midline shift to the left. Will refer to neurosurgery urgently for further consideration.       Relevant Orders   Vitamin B12 (Completed)   Comprehensive metabolic panel (Completed)   Lipid panel (Completed)   CBC (Completed)   VITAMIN D 25 Hydroxy (Vit-D Deficiency, Fractures) (Completed)   Ambulatory referral to Neurosurgery   Mastoid disorder -  Primary   Relevant Orders   Vitamin B12 (Completed)   Comprehensive metabolic panel (Completed)   Lipid panel (Completed)   CBC (Completed)   VITAMIN D 25 Hydroxy (Vit-D Deficiency, Fractures) (Completed)   Ambulatory referral to Neurosurgery    Other Visit Diagnoses    Right facial swelling        Relevant Orders    Vitamin B12 (Completed)    Comprehensive metabolic panel (Completed)    Lipid panel (Completed)    CBC (Completed)    VITAMIN D 25 Hydroxy (Vit-D Deficiency, Fractures) (Completed)    Ambulatory referral to Neurosurgery    Benign essential HTN        Relevant Medications    hydrochlorothiazide (HYDRODIURIL) 12.5 MG tablet    Other Relevant Orders    Vitamin B12 (Completed)    Comprehensive metabolic panel (Completed)    Lipid panel (Completed)    CBC (Completed)    VITAMIN D 25 Hydroxy (Vit-D Deficiency, Fractures) (Completed)       I have discontinued Ms. Mcadory's hydrochlorothiazide. I am also having her start on hydrochlorothiazide. Additionally, I am having her maintain her fish oil-omega-3 fatty acids, calcium carbonate, DILT-XR, atorvastatin, and valsartan.  Meds ordered this encounter  Medications  . hydrochlorothiazide (HYDRODIURIL) 12.5 MG tablet    Sig: Take 1 tablet (12.5 mg total) by mouth daily.    Dispense:  1 tablet    Refill:  0     Penni Homans, MD

## 2016-01-15 NOTE — Assessment & Plan Note (Signed)
Encouraged to get adequate exercise, calcium and vitamin d intake 

## 2016-01-15 NOTE — Assessment & Plan Note (Signed)
Encouraged daily supplements 

## 2016-01-18 ENCOUNTER — Telehealth: Payer: Self-pay | Admitting: Family Medicine

## 2016-01-18 NOTE — Telephone Encounter (Signed)
error:[id number] ° °

## 2016-01-30 ENCOUNTER — Telehealth: Payer: Self-pay | Admitting: Family Medicine

## 2016-01-30 NOTE — Telephone Encounter (Signed)
Faxed MRI to Dr. Wilburn Cornelia at [phone]

## 2016-01-30 NOTE — Telephone Encounter (Signed)
Pt called in to request that a copy of her MRI results  be sent over to Dr. Wilburn Cornelia - ENT

## 2016-03-08 ENCOUNTER — Other Ambulatory Visit: Payer: Self-pay | Admitting: Family Medicine

## 2016-03-23 ENCOUNTER — Other Ambulatory Visit: Payer: Self-pay | Admitting: Family Medicine

## 2016-04-19 ENCOUNTER — Other Ambulatory Visit: Payer: Self-pay | Admitting: Family Medicine

## 2016-05-12 ENCOUNTER — Other Ambulatory Visit: Payer: Self-pay | Admitting: Family Medicine

## 2016-05-12 DIAGNOSIS — E782 Mixed hyperlipidemia: Secondary | ICD-10-CM

## 2016-05-12 DIAGNOSIS — I10 Essential (primary) hypertension: Secondary | ICD-10-CM

## 2016-05-16 ENCOUNTER — Other Ambulatory Visit: Payer: Self-pay | Admitting: Family Medicine

## 2016-05-16 NOTE — Telephone Encounter (Signed)
Medication filled to pharmacy as requested.   

## 2016-06-30 ENCOUNTER — Other Ambulatory Visit: Payer: Self-pay | Admitting: Family Medicine

## 2016-09-10 ENCOUNTER — Other Ambulatory Visit: Payer: Self-pay | Admitting: Family Medicine

## 2016-09-17 ENCOUNTER — Telehealth: Payer: Self-pay | Admitting: Family Medicine

## 2016-09-17 NOTE — Telephone Encounter (Signed)
Caller name: Brecken Michalski Relationship to patient: self Can be reached: [phone] Pharmacy: [facility]  Reason for call: Pt states that she received msg from [facility] that they are not able to get DILT-XR 120mg  24hr capsules. They advised pt to contact PCP for alternative med. Pt would like call back as well as RX be sent to [facility] for alternate.

## 2016-09-18 NOTE — Telephone Encounter (Signed)
Message sent to pcp for review  PC

## 2016-09-18 NOTE — Telephone Encounter (Signed)
this medication is no longer available.  This message was sent on Monday.  [facility] to [facility]-----PATIENT WANTS A CALL BACK ONCE DONE AT [phone]

## 2016-09-18 NOTE — Telephone Encounter (Signed)
She called back and she can get them at [facility], but not available at [facility]... What is best??? Alternative or if you think this medication is better than the alternative she will just get at [facility]??? She has 5 days left and is a little stressed over this.

## 2016-09-18 NOTE — Telephone Encounter (Signed)
We know how well she does on med so if not cost prohibitive would switch prescription to [facility]. Otherwise can switch

## 2016-09-18 NOTE — Telephone Encounter (Signed)
Patient agreed to just get from [facility] in [place] and will stay on med since PCP prefers and thinks best for her.

## 2016-09-20 MED ORDER — DILTIAZEM HCL ER 120 MG PO CP24: 120.0000 mg | ORAL_CAPSULE | Freq: Every day | ORAL | 0 refills | Status: DC

## 2016-09-20 NOTE — Telephone Encounter (Signed)
Sent in #[phone] as patient requested to [facility] on [address]

## 2016-09-20 NOTE — Addendum Note (Signed)
Addended by: Sharon Seller B on: 09/20/2016 10:23 AM   Modules accepted: Orders

## 2016-09-24 ENCOUNTER — Other Ambulatory Visit: Payer: Self-pay | Admitting: Family Medicine

## 2016-09-24 NOTE — Telephone Encounter (Signed)
Medication filled to pharmacy as requested.   

## 2016-10-08 NOTE — Progress Notes (Addendum)
Subjective:   Lori Bright is a 83 y.o. female who presents for Medicare Annual (Subsequent) preventive examination.  Review of Systems:  No ROS.  Medicare Wellness Visit. Cardiac Risk Factors include: advanced age (>51men, >58 women);dyslipidemia;hypertension Sleep patterns:Pt states she is not sleeping well and going to try Melatonin.  Home Safety/Smoke Alarms: Feels safe in home. Smoke alarms in place.   Living environment; residence and Firearm Safety: Lives alone. Lives in 2 story home. States she has no trouble with stairs. No guns.  Seat Belt Safety/Bike Helmet: Wears seat belt.   Counseling:   Eye Exam- Dr.Shapiro appt tomorrow. Dental- Dr.Jones every 6 months.  Female:   Pap-  n/a due to age 69- Last 10/31/15: BI-RADS category 0 incomplete. 4 mm nodule left breast. 11/02/15: no evidence of malignancy. f/u 1 yr. Dexa scan- Last 06/03/15: osteoporosis mild and stable.      CCS- Last 07/17/03: no result on file. No longer screening due to age.     Objective:     Vitals: BP 130/84 (BP Location: Left Arm, Patient Position: Sitting, Cuff Size: Normal)   Pulse 84   Temp 98 F (36.7 C) (Oral)   Resp 18   Wt 135 lb 9.6 oz (61.5 kg)   SpO2 96%   BMI 22.57 kg/m   Body mass index is 22.57 kg/m.   Tobacco History  Smoking Status  . Never Smoker  Smokeless Tobacco  . Never Used     Counseling given: Not Answered   Past Medical History:  Diagnosis Date  . Anemia 11/28/2011  . Annual physical exam 05/24/2013  . Cervical cancer screening 04/03/2011  . Depression 04/25/2014  . Disorder of vitamin B12 10/09/2016  . Hyperlipidemia 11/28/2011  . Medicare annual wellness visit, subsequent 05/24/2013  . Parotiditis 10/18/2012  . Urinary incontinence 11/22/2013  . Wrist pain, right 04/03/2011   Past Surgical History:  Procedure Laterality Date  . CATARACT EXTRACTION     b/l  . HEMORRHOID SURGERY    . TONSILLECTOMY     Family History  Problem Relation Age of Onset  . Hip  fracture Mother 60  . Heart disease Mother     w/ mitral valve replacement  . Obesity Mother   . Cancer Father     hepatic/ cigar smoker  . Cancer Brother     esophageal/ smoker  . Glaucoma Brother   . Other Daughter     early hysterectomy for menometroraghia  . Deep vein thrombosis Son   . Hip fracture Maternal Grandmother   . Other Maternal Grandmother     bladder prolapse  . Hip fracture Paternal Grandfather   . Glaucoma Paternal Grandfather   . Deep vein thrombosis Son    History  Sexual Activity  . Sexual activity: Not on file    Outpatient Encounter Prescriptions as of 10/09/2016  Medication Sig  . atorvastatin (LIPITOR) 10 MG tablet TAKE 1 TABLET DAILY  . calcium carbonate (OS-CAL) 600 MG TABS Take 1,200 mg by mouth daily with breakfast.   . fish oil-omega-3 fatty acids 1000 MG capsule Take 1 g by mouth daily.   . hydrochlorothiazide (MICROZIDE) 12.5 MG capsule TAKE 1 CAPSULE DAILY  . valsartan (DIOVAN) 160 MG tablet TAKE 1 TABLET DAILY  . [DISCONTINUED] diltiazem (DILT-XR) 120 MG 24 hr capsule Take 1 capsule (120 mg total) by mouth daily.  Marland Kitchen amLODipine (NORVASC) 5 MG tablet Take 1 tablet (5 mg total) by mouth daily.  . [DISCONTINUED] amLODipine (NORVASC) 5 MG tablet  Take 1 tablet (5 mg total) by mouth daily.  . [DISCONTINUED] diltiazem (DILT-XR) 120 MG 24 hr capsule Take 1 capsule (120 mg total) by mouth daily.  . [DISCONTINUED] hydrochlorothiazide (HYDRODIURIL) 12.5 MG tablet Take 1 tablet (12.5 mg total) by mouth daily.   No facility-administered encounter medications on file as of 10/09/2016.     Activities of Daily Living In your present state of health, do you have any difficulty performing the following activities: 10/09/2016 01/10/2016  Hearing? Y N  Vision? N N  Difficulty concentrating or making decisions? N N  Walking or climbing stairs? N N  Dressing or bathing? N N  Doing errands, shopping? N N  Preparing Food and eating ? N -  Using the Toilet? N -    In the past six months, have you accidently leaked urine? N -  Do you have problems with loss of bowel control? N -  Managing your Medications? N -  Managing your Finances? N -  Housekeeping or managing your Housekeeping? N -  Some recent data might be hidden    Patient Care Team: Mosie Lukes, MD as PCP - General    Assessment:    Physical assessment deferred to PCP.  Exercise Activities and Dietary recommendations Current Exercise Habits: Home exercise routine, Type of exercise: strength training/weights;stretching;treadmill, Frequency (Times/Week): 5, Intensity: Moderate   Diet (meal preparation, eat out, water intake, caffeinated beverages, dairy products, fruits and vegetables): in general, a "healthy" diet   Breakfast: yogurt, granola, fruit and coffee Lunch: sandwich or eggs Dinner: Healthy choice dinner and salad. Drinks water throughout the day.  Goals    . Maintain her current healthy lifestyle.      Fall Risk Fall Risk  10/09/2016 10/09/2016 05/24/2015 04/22/2014 05/24/2013  Falls in the past year? No No Yes No No  Number falls in past yr: - - 1 - -  Injury with Fall? - - Yes - -   Depression Screen PHQ 2/9 Scores 10/09/2016 10/09/2016 05/24/2015 04/22/2014  PHQ - 2 Score 0 0 2 2  PHQ- 9 Score - - 5 -     Cognitive Function MMSE - Mini Mental State Exam 10/09/2016  Orientation to time 5  Orientation to Place 5  Registration 3  Attention/ Calculation 4  Recall 2  Language- name 2 objects 2  Language- repeat 1  Language- follow 3 step command 3  Language- read & follow direction 1  Write a sentence 1  Copy design 1  Total score 28        Immunization History  Administered Date(s) Administered  . Influenza Split 03/30/2011, 04/29/2012  . Influenza Whole 05/24/2010  . Influenza, High Dose Seasonal PF 05/19/2013  . Influenza,inj,Quad PF,36+ Mos 04/22/2014  . Influenza-Unspecified 04/01/2015, 04/14/2016  . Pneumococcal Conjugate-13 05/19/2013  .  Pneumococcal Polysaccharide-23 05/24/2015  . Tdap 05/28/2013   Screening Tests Health Maintenance  Topic Date Due  . MAMMOGRAM  10/30/2016  . TETANUS/TDAP  05/29/2023  . INFLUENZA VACCINE  Completed  . DEXA SCAN  Completed  . PNA vac Low Risk Adult  Completed      Plan:     Follow up with Dr.Blyth as directed.  Continue to eat heart healthy diet (full of fruits, vegetables, whole grains, lean protein, water--limit salt, fat, and sugar intake) and increase physical activity as tolerated.  Continue doing brain stimulating activities (puzzles, reading, adult coloring books, staying active) to keep memory sharp.    During the course of the visit the  patient was educated and counseled about the following appropriate screening and preventive services:   Vaccines to include Pneumoccal, Influenza, Td, HCV  Cardiovascular Disease  Colorectal cancer screening  Bone density screening  Diabetes screening  Glaucoma screening  Mammography/PAP  Nutrition counseling   Patient Instructions (the written plan) was given to the patient.   Shela Nevin, [place]  10/09/2016  Medical screening examination was performed by non-physician practitioner and as supervising physician I was immediately available for consultation/collaboration. I have reviewed documentation and agree with assessment and plan.  Penni Homans, MD

## 2016-10-08 NOTE — Progress Notes (Signed)
Pre visit review using our clinic review tool, if applicable. No additional management support is needed unless otherwise documented below in the visit note. 

## 2016-10-09 ENCOUNTER — Encounter: Payer: Self-pay | Admitting: Family Medicine

## 2016-10-09 ENCOUNTER — Ambulatory Visit (INDEPENDENT_AMBULATORY_CARE_PROVIDER_SITE_OTHER): Payer: Medicare Other | Admitting: Family Medicine

## 2016-10-09 VITALS — BP 130/84 | HR 84 | Temp 98.0°F | Resp 18 | Wt 135.6 lb

## 2016-10-09 DIAGNOSIS — R0602 Shortness of breath: Secondary | ICD-10-CM | POA: Diagnosis not present

## 2016-10-09 DIAGNOSIS — E538 Deficiency of other specified B group vitamins: Secondary | ICD-10-CM

## 2016-10-09 DIAGNOSIS — E559 Vitamin D deficiency, unspecified: Secondary | ICD-10-CM | POA: Diagnosis not present

## 2016-10-09 DIAGNOSIS — R5383 Other fatigue: Secondary | ICD-10-CM

## 2016-10-09 DIAGNOSIS — Z0000 Encounter for general adult medical examination without abnormal findings: Secondary | ICD-10-CM

## 2016-10-09 DIAGNOSIS — M810 Age-related osteoporosis without current pathological fracture: Secondary | ICD-10-CM | POA: Diagnosis not present

## 2016-10-09 DIAGNOSIS — R739 Hyperglycemia, unspecified: Secondary | ICD-10-CM

## 2016-10-09 DIAGNOSIS — E782 Mixed hyperlipidemia: Secondary | ICD-10-CM

## 2016-10-09 DIAGNOSIS — I10 Essential (primary) hypertension: Secondary | ICD-10-CM | POA: Diagnosis not present

## 2016-10-09 HISTORY — DX: Deficiency of other specified B group vitamins: E53.8

## 2016-10-09 MED ORDER — AMLODIPINE BESYLATE 5 MG PO TABS: 5.0000 mg | ORAL_TABLET | Freq: Every day | ORAL | 3 refills | Status: DC

## 2016-10-09 MED ORDER — DILTIAZEM HCL ER 120 MG PO CP24: 120.0000 mg | ORAL_CAPSULE | Freq: Every day | ORAL | 1 refills | Status: DC

## 2016-10-09 NOTE — Assessment & Plan Note (Addendum)
Level checked today is WNL, no changes

## 2016-10-09 NOTE — Assessment & Plan Note (Signed)
Encouraged heart healthy diet, increase exercise, avoid trans fats, consider a krill oil cap daily 

## 2016-10-09 NOTE — Assessment & Plan Note (Signed)
Well controlled, no changes to meds. Encouraged heart healthy diet such as the DASH diet and exercise as tolerated.  °

## 2016-10-09 NOTE — Assessment & Plan Note (Signed)
Increase leafy greens, consider increased lean red meat and using cast iron cookware. Continue to monitor, report any concerns 

## 2016-10-09 NOTE — Progress Notes (Signed)
Pre visit review using our clinic review tool, if applicable. No additional management support is needed unless otherwise documented below in the visit note. 

## 2016-10-09 NOTE — Progress Notes (Signed)
Subjective:  I acted as a Education administrator for Dr. Charlett Bright. [name], [place]   Patient ID: Lori Bright, female    DOB: 06/11/1924, 83 y.o.   MRN: [id number]  Chief Complaint  Patient presents with  . Follow-up  . Hypertension  . Hyperlipidemia    Hypertension  This is a chronic problem. The problem is unchanged. The problem is controlled. Associated symptoms include malaise/fatigue and shortness of breath. Pertinent negatives include no blurred vision, chest pain, headaches or palpitations.  Hyperlipidemia  This is a chronic problem. The problem is controlled. Associated symptoms include shortness of breath. Pertinent negatives include no chest pain.    Patient is in today for a follow up. Patient had problems purchasing her diltiazem due to cost. Medication was switched to amlodipine 5mg . She reports feeling well just frustrated with fatigue and on further questioning notes some increased SOB with exertion. Despite this fact she continues to attend yoga and taichi regularly. She manages her ADLs well and eat a heart healthy diet.   Patient Care Team: Mosie Lukes, MD as PCP - General   Past Medical History:  Diagnosis Date  . Anemia 11/28/2011  . Annual physical exam 05/24/2013  . Cervical cancer screening 04/03/2011  . Depression 04/25/2014  . Disorder of vitamin B12 10/09/2016  . Hyperlipidemia 11/28/2011  . Medicare annual wellness visit, subsequent 05/24/2013  . Parotiditis 10/18/2012  . Urinary incontinence 11/22/2013  . Wrist pain, right 04/03/2011    Past Surgical History:  Procedure Laterality Date  . CATARACT EXTRACTION     b/l  . HEMORRHOID SURGERY    . TONSILLECTOMY      Family History  Problem Relation Age of Onset  . Hip fracture Mother 44  . Heart disease Mother     w/ mitral valve replacement  . Obesity Mother   . Cancer Father     hepatic/ cigar smoker  . Cancer Brother     esophageal/ smoker  . Glaucoma Brother   . Other Daughter     early hysterectomy for  menometroraghia  . Deep vein thrombosis Son   . Hip fracture Maternal Grandmother   . Other Maternal Grandmother     bladder prolapse  . Hip fracture Paternal Grandfather   . Glaucoma Paternal Grandfather   . Deep vein thrombosis Son     Social History   Social History  . Marital status: Married    Spouse name: N/A  . Number of children: N/A  . Years of education: N/A   Occupational History  . Not on file.   Social History Main Topics  . Smoking status: Never Smoker  . Smokeless tobacco: Never Used  . Alcohol use Not on file     Comment: rare  . Drug use: No  . Sexual activity: Not on file   Other Topics Concern  . Not on file   Social History Narrative  . No narrative on file    Outpatient Medications Prior to Visit  Medication Sig Dispense Refill  . atorvastatin (LIPITOR) 10 MG tablet TAKE 1 TABLET DAILY 90 tablet 1  . calcium carbonate (OS-CAL) 600 MG TABS Take 1,200 mg by mouth daily with breakfast.     . fish oil-omega-3 fatty acids 1000 MG capsule Take 1 g by mouth daily.     . hydrochlorothiazide (MICROZIDE) 12.5 MG capsule TAKE 1 CAPSULE DAILY 90 capsule 3  . valsartan (DIOVAN) 160 MG tablet TAKE 1 TABLET DAILY 90 tablet 0  . diltiazem (DILT-XR) 120  MG 24 hr capsule Take 1 capsule (120 mg total) by mouth daily. 30 capsule 0  . hydrochlorothiazide (HYDRODIURIL) 12.5 MG tablet Take 1 tablet (12.5 mg total) by mouth daily. 1 tablet 0   No facility-administered medications prior to visit.     No Known Allergies  Review of Systems  Constitutional: Positive for malaise/fatigue. Negative for fever.  HENT: Negative for congestion.   Eyes: Negative for blurred vision.  Respiratory: Positive for shortness of breath. Negative for cough.   Cardiovascular: Negative for chest pain, palpitations and leg swelling.  Gastrointestinal: Negative for vomiting.  Musculoskeletal: Negative for back pain.  Skin: Negative for rash.  Neurological: Negative for loss of  consciousness and headaches.       Objective:    Physical Exam  Constitutional: She is oriented to person, place, and time. She appears well-developed and well-nourished. No distress.  HENT:  Head: Normocephalic and atraumatic.  Eyes: Conjunctivae are normal.  Neck: Normal range of motion. No thyromegaly present.  Cardiovascular: Normal rate and regular rhythm.   Pulmonary/Chest: Effort normal and breath sounds normal. She has no wheezes.  Abdominal: Soft. Bowel sounds are normal. There is no tenderness.  Musculoskeletal: Normal range of motion. She exhibits no edema or deformity.  Neurological: She is alert and oriented to person, place, and time.  Skin: Skin is warm and dry. She is not diaphoretic.  Psychiatric: She has a normal mood and affect.    BP 130/84 (BP Location: Left Arm, Patient Position: Sitting, Cuff Size: Normal)   Pulse 84   Temp 98 F (36.7 C) (Oral)   Resp 18   Wt 135 lb 9.6 oz (61.5 kg)   SpO2 96%   BMI 22.57 kg/m  Wt Readings from Last 3 Encounters:  10/09/16 135 lb 9.6 oz (61.5 kg)  01/10/16 137 lb 6 oz (62.3 kg)  06/07/15 137 lb 4 oz (62.3 kg)   BP Readings from Last 3 Encounters:  10/09/16 130/84  01/10/16 138/70  06/07/15 124/68     Immunization History  Administered Date(s) Administered  . Influenza Split 03/30/2011, 04/29/2012  . Influenza Whole 05/24/2010  . Influenza, High Dose Seasonal PF 05/19/2013  . Influenza,inj,Quad PF,36+ Mos 04/22/2014  . Influenza-Unspecified 04/01/2015, 04/14/2016  . Pneumococcal Conjugate-13 05/19/2013  . Pneumococcal Polysaccharide-23 05/24/2015  . Tdap 05/28/2013    Health Maintenance  Topic Date Due  . MAMMOGRAM  10/30/2016  . TETANUS/TDAP  05/29/2023  . INFLUENZA VACCINE  Completed  . DEXA SCAN  Completed  . PNA vac Low Risk Adult  Completed    Lab Results  Component Value Date   WBC 7.9 10/09/2016   HGB 13.5 10/09/2016   HCT 41.4 10/09/2016   PLT 232.0 10/09/2016   GLUCOSE 100 (H)  10/09/2016   CHOL 150 10/09/2016   TRIG 162.0 (H) 10/09/2016   HDL 47.90 10/09/2016   LDLCALC 69 10/09/2016   ALT 16 10/09/2016   AST 17 10/09/2016   NA 141 10/09/2016   K 4.4 10/09/2016   CL 104 10/09/2016   CREATININE 0.71 10/09/2016   BUN 21 10/09/2016   CO2 31 10/09/2016   TSH 4.86 (H) 10/09/2016   HGBA1C 5.9 10/09/2016    Lab Results  Component Value Date   TSH 4.86 (H) 10/09/2016   Lab Results  Component Value Date   WBC 7.9 10/09/2016   HGB 13.5 10/09/2016   HCT 41.4 10/09/2016   MCV 93.6 10/09/2016   PLT 232.0 10/09/2016   Lab Results  Component Value  Date   NA 141 10/09/2016   K 4.4 10/09/2016   CO2 31 10/09/2016   GLUCOSE 100 (H) 10/09/2016   BUN 21 10/09/2016   CREATININE 0.71 10/09/2016   BILITOT 0.3 10/09/2016   ALKPHOS 53 10/09/2016   AST 17 10/09/2016   ALT 16 10/09/2016   PROT 7.2 10/09/2016   ALBUMIN 4.4 10/09/2016   CALCIUM 10.2 10/09/2016   GFR 82.03 10/09/2016   Lab Results  Component Value Date   CHOL 150 10/09/2016   Lab Results  Component Value Date   HDL 47.90 10/09/2016   Lab Results  Component Value Date   LDLCALC 69 10/09/2016   Lab Results  Component Value Date   TRIG 162.0 (H) 10/09/2016   Lab Results  Component Value Date   CHOLHDL 3 10/09/2016   Lab Results  Component Value Date   HGBA1C 5.9 10/09/2016         Assessment & Plan:   Problem List Items Addressed This Visit    Vitamin D deficiency    Level checked today is WNL, no changes      Relevant Orders   Vitamin D 1,25 dihydroxy (Completed)   MIXED HYPERLIPIDEMIA - Primary    Encouraged heart healthy diet, increase exercise, avoid trans fats, consider a krill oil cap daily      Relevant Medications   amLODipine (NORVASC) 5 MG tablet   Other Relevant Orders   Lipid panel (Completed)   ESSENTIAL HYPERTENSION, BENIGN    Well controlled, no changes to meds. Encouraged heart healthy diet such as the DASH diet and exercise as tolerated.        Relevant Medications   amLODipine (NORVASC) 5 MG tablet   Other Relevant Orders   CBC (Completed)   Comprehensive metabolic panel (Completed)   TSH (Completed)   Osteoporosis    Encouraged to get adequate exercise, calcium and vitamin d intake      Disorder of vitamin B12   Relevant Orders   Vitamin B12 (Completed)   Preventative health care   SOB (shortness of breath)    With fatigue and worsening heart murmur, will proceed with Echo to further investigate and she is encouraged to stay as active as tolerated and to seek care if it worsens.       Relevant Orders   ECHOCARDIOGRAM COMPLETE   Hyperglycemia   Relevant Orders   Hemoglobin A1c (Completed)    Other Visit Diagnoses    Fatigue, unspecified type       Relevant Orders   ECHOCARDIOGRAM COMPLETE   Encounter for Medicare annual wellness exam          I have discontinued Ms. Nikolic's diltiazem and diltiazem. I am also having her maintain her fish oil-omega-3 fatty acids, calcium carbonate, hydrochlorothiazide, valsartan, atorvastatin, and amLODipine.  Meds ordered this encounter  Medications  . DISCONTD: diltiazem (DILT-XR) 120 MG 24 hr capsule    Sig: Take 1 capsule (120 mg total) by mouth daily.    Dispense:  90 capsule    Refill:  1  . DISCONTD: amLODipine (NORVASC) 5 MG tablet    Sig: Take 1 tablet (5 mg total) by mouth daily.    Dispense:  90 tablet    Refill:  3  . amLODipine (NORVASC) 5 MG tablet    Sig: Take 1 tablet (5 mg total) by mouth daily.    Dispense:  90 tablet    Refill:  3    CMA served as scribe during this visit. History,  Physical and Plan performed by medical provider. Documentation and orders reviewed and attested to.  Penni Homans, MD

## 2016-10-09 NOTE — Patient Instructions (Addendum)
Take [employer]  Right on [employer] Continue on [address] unitl you see Animal nutritionist Left on Animal nutritionist Then a Right on [place]. Left into the [place] 65 Years and Older, Female Preventive care refers to lifestyle choices and visits with your health care provider that can promote health and wellness. What does preventive care include?  A yearly physical exam. This is also called an annual well check.  Dental exams once or twice a year.  Routine eye exams. Ask your health care provider how often you should have your eyes checked.  Personal lifestyle choices, including:  Daily care of your teeth and gums.  Regular physical activity.  Eating a healthy diet.  Avoiding tobacco and drug use.  Limiting alcohol use.  Practicing safe sex.  Taking low-dose aspirin every day.  Taking vitamin and mineral supplements as recommended by your health care provider. What happens during an annual well check? The services and screenings done by your health care provider during your annual well check will depend on your age, overall health, lifestyle risk factors, and family history of disease. Counseling  Your health care provider may ask you questions about your:  Alcohol use.  Tobacco use.  Drug use.  Emotional well-being.  Home and relationship well-being.  Sexual activity.  Eating habits.  History of falls.  Memory and ability to understand (cognition).  Work and work Statistician.  Reproductive health. Screening  You may have the following tests or measurements:  Height, weight, and BMI.  Blood pressure.  Lipid and cholesterol levels. These may be checked every 5 years, or more frequently if you are over 81 years old.  Skin check.  Lung cancer screening. You may have this screening every year starting at age 81 if you have a 30-pack-year history of smoking and currently smoke or have quit within the past 15  years.  Fecal occult blood test (FOBT) of the stool. You may have this test every year starting at age 81.  Flexible sigmoidoscopy or colonoscopy. You may have a sigmoidoscopy every 5 years or a colonoscopy every 10 years starting at age 81.  Hepatitis C blood test.  Hepatitis B blood test.  Sexually transmitted disease (STD) testing.  Diabetes screening. This is done by checking your blood sugar (glucose) after you have not eaten for a while (fasting). You may have this done every 1-3 years.  Bone density scan. This is done to screen for osteoporosis. You may have this done starting at age 81.  Mammogram. This may be done every 1-2 years. Talk to your health care provider about how often you should have regular mammograms. Talk with your health care provider about your test results, treatment options, and if necessary, the need for more tests. Vaccines  Your health care provider may recommend certain vaccines, such as:  Influenza vaccine. This is recommended every year.  Tetanus, diphtheria, and acellular pertussis (Tdap, Td) vaccine. You may need a Td booster every 10 years.  Varicella vaccine. You may need this if you have not been vaccinated.  Zoster vaccine. You may need this after age 81.  Measles, mumps, and rubella (MMR) vaccine. You may need at least one dose of MMR if you were born in 1957 or later. You may also need a second dose.  Pneumococcal 13-valent conjugate (PCV13) vaccine. One dose is recommended after age 81.  Pneumococcal polysaccharide (PPSV23) vaccine. One dose is recommended after age 81.  Meningococcal vaccine. You may need this  if you have certain conditions.  Hepatitis A vaccine. You may need this if you have certain conditions or if you travel or work in places where you may be exposed to hepatitis A.  Hepatitis B vaccine. You may need this if you have certain conditions or if you travel or work in places where you may be exposed to hepatitis  B.  Haemophilus influenzae type b (Hib) vaccine. You may need this if you have certain conditions. Talk to your health care provider about which screenings and vaccines you need and how often you need them. This information is not intended to replace advice given to you by your health care provider. Make sure you discuss any questions you have with your health care provider. Document Released: 07/29/2015 Document Revised: 03/21/2016 Document Reviewed: 05/03/2015 Elsevier Interactive Patient Education  2017 [employer].

## 2016-10-10 LAB — CBC
HCT: 41.4 % (ref 36.0–46.0)
Hemoglobin: 13.5 g/dL (ref 12.0–15.0)
MCHC: 32.5 g/dL (ref 30.0–36.0)
MCV: 93.6 fl (ref 78.0–100.0)
Platelets: 232 10*3/uL (ref 150.0–400.0)
RBC: 4.42 Mil/uL (ref 3.87–5.11)
RDW: 15.4 % (ref 11.5–15.5)
WBC: 7.9 10*3/uL (ref 4.0–10.5)

## 2016-10-10 LAB — LIPID PANEL
CHOL/HDL RATIO: 3
Cholesterol: 150 mg/dL (ref 0–200)
HDL: 47.9 mg/dL (ref >39.00)
LDL Cholesterol: 69 mg/dL (ref 0–99)
NONHDL: 101.71
Triglycerides: 162 mg/dL — ABNORMAL HIGH (ref 0.0–149.0)
VLDL: 32.4 mg/dL (ref 0.0–40.0)

## 2016-10-10 LAB — COMPREHENSIVE METABOLIC PANEL
ALBUMIN: 4.4 g/dL (ref 3.5–5.2)
ALK PHOS: 53 U/L (ref 39–117)
ALT: 16 U/L (ref 0–35)
AST: 17 U/L (ref 0–37)
BUN: 21 mg/dL (ref 6–23)
CALCIUM: 10.2 mg/dL (ref 8.4–10.5)
CHLORIDE: 104 meq/L (ref 96–112)
CO2: 31 mEq/L (ref 19–32)
Creatinine, Ser: 0.71 mg/dL (ref 0.40–1.20)
GFR: 82.03 mL/min (ref >60.00)
Glucose, Bld: 100 mg/dL — ABNORMAL HIGH (ref 70–99)
Potassium: 4.4 mEq/L (ref 3.5–5.1)
SODIUM: 141 meq/L (ref 135–145)
Total Bilirubin: 0.3 mg/dL (ref 0.2–1.2)
Total Protein: 7.2 g/dL (ref 6.0–8.3)

## 2016-10-10 LAB — HEMOGLOBIN A1C: Hgb A1c MFr Bld: 5.9 % (ref 4.6–6.5)

## 2016-10-10 LAB — VITAMIN B12: Vitamin B-12: >1500 pg/mL — ABNORMAL HIGH (ref 211–911)

## 2016-10-10 LAB — TSH: TSH: 4.86 u[IU]/mL — ABNORMAL HIGH (ref 0.35–4.50)

## 2016-10-11 ENCOUNTER — Encounter: Payer: Self-pay | Admitting: Family Medicine

## 2016-10-11 ENCOUNTER — Other Ambulatory Visit: Payer: Self-pay | Admitting: Family Medicine

## 2016-10-11 DIAGNOSIS — R7989 Other specified abnormal findings of blood chemistry: Secondary | ICD-10-CM

## 2016-10-12 LAB — VITAMIN D 1,25 DIHYDROXY
VITAMIN D 1, 25 (OH) TOTAL: 40 pg/mL (ref 18–72)
Vitamin D3 1, 25 (OH)2: 40 pg/mL

## 2016-10-13 DIAGNOSIS — R739 Hyperglycemia, unspecified: Secondary | ICD-10-CM | POA: Insufficient documentation

## 2016-10-13 DIAGNOSIS — Z0000 Encounter for general adult medical examination without abnormal findings: Secondary | ICD-10-CM | POA: Insufficient documentation

## 2016-10-13 DIAGNOSIS — R0602 Shortness of breath: Secondary | ICD-10-CM | POA: Insufficient documentation

## 2016-10-13 NOTE — Assessment & Plan Note (Signed)
Encouraged to get adequate exercise, calcium and vitamin d intake 

## 2016-10-13 NOTE — Assessment & Plan Note (Signed)
With fatigue and worsening heart murmur, will proceed with Echo to further investigate and she is encouraged to stay as active as tolerated and to seek care if it worsens.

## 2016-10-13 NOTE — Assessment & Plan Note (Signed)
hgba1c acceptable, minimize simple carbs. Increase exercise as tolerated.  

## 2016-10-13 NOTE — Assessment & Plan Note (Signed)
Vitamin B12 is elevated advised to decrease doses per week by 2

## 2016-10-13 NOTE — Progress Notes (Signed)
Subjective:  I acted as a Education administrator for Dr. Charlett Blake. [name], [place]   Patient ID: Lori Bright, female    DOB: Nov 20, 1923, 83 y.o.   MRN: [id number]  Chief Complaint  Patient presents with  . Follow-up  . Hypertension  . Hyperlipidemia    Hypertension  This is a chronic problem. The problem is unchanged. The problem is controlled. Associated symptoms include malaise/fatigue and shortness of breath. Pertinent negatives include no blurred vision, chest pain, headaches or palpitations.  Hyperlipidemia  This is a chronic problem. The problem is controlled. Associated symptoms include shortness of breath. Pertinent negatives include no chest pain.    Patient is in today for a follow up. Patient had problems purchasing her diltiazem due to cost. Medication was switched to amlodipine 5mg . She reports feeling well just frustrated with fatigue and on further questioning notes some increased SOB with exertion. Despite this fact she continues to attend yoga and taichi regularly. She manages her ADLs well and eat a heart healthy diet.   Patient Care Team: Mosie Lukes, MD as PCP - General   Past Medical History:  Diagnosis Date  . Anemia 11/28/2011  . Annual physical exam 05/24/2013  . Cervical cancer screening 04/03/2011  . Depression 04/25/2014  . Disorder of vitamin B12 10/09/2016  . Hyperlipidemia 11/28/2011  . Medicare annual wellness visit, subsequent 05/24/2013  . Parotiditis 10/18/2012  . Urinary incontinence 11/22/2013  . Wrist pain, right 04/03/2011    Past Surgical History:  Procedure Laterality Date  . CATARACT EXTRACTION     b/l  . HEMORRHOID SURGERY    . TONSILLECTOMY      Family History  Problem Relation Age of Onset  . Hip fracture Mother 46  . Heart disease Mother     w/ mitral valve replacement  . Obesity Mother   . Cancer Father     hepatic/ cigar smoker  . Cancer Brother     esophageal/ smoker  . Glaucoma Brother   . Other Daughter     early hysterectomy for  menometroraghia  . Deep vein thrombosis Son   . Hip fracture Maternal Grandmother   . Other Maternal Grandmother     bladder prolapse  . Hip fracture Paternal Grandfather   . Glaucoma Paternal Grandfather   . Deep vein thrombosis Son     Social History   Social History  . Marital status: Married    Spouse name: N/A  . Number of children: N/A  . Years of education: N/A   Occupational History  . Not on file.   Social History Main Topics  . Smoking status: Never Smoker  . Smokeless tobacco: Never Used  . Alcohol use Not on file     Comment: rare  . Drug use: No  . Sexual activity: Not on file   Other Topics Concern  . Not on file   Social History Narrative  . No narrative on file    Outpatient Medications Prior to Visit  Medication Sig Dispense Refill  . atorvastatin (LIPITOR) 10 MG tablet TAKE 1 TABLET DAILY 90 tablet 1  . calcium carbonate (OS-CAL) 600 MG TABS Take 1,200 mg by mouth daily with breakfast.     . fish oil-omega-3 fatty acids 1000 MG capsule Take 1 g by mouth daily.     . hydrochlorothiazide (MICROZIDE) 12.5 MG capsule TAKE 1 CAPSULE DAILY 90 capsule 3  . valsartan (DIOVAN) 160 MG tablet TAKE 1 TABLET DAILY 90 tablet 0  . diltiazem (DILT-XR) 120  MG 24 hr capsule Take 1 capsule (120 mg total) by mouth daily. 30 capsule 0  . hydrochlorothiazide (HYDRODIURIL) 12.5 MG tablet Take 1 tablet (12.5 mg total) by mouth daily. 1 tablet 0   No facility-administered medications prior to visit.     No Known Allergies  Review of Systems  Constitutional: Positive for malaise/fatigue. Negative for fever.  HENT: Negative for congestion.   Eyes: Negative for blurred vision.  Respiratory: Positive for shortness of breath. Negative for cough.   Cardiovascular: Negative for chest pain, palpitations and leg swelling.  Gastrointestinal: Negative for vomiting.  Musculoskeletal: Negative for back pain.  Skin: Negative for rash.  Neurological: Negative for loss of  consciousness and headaches.       Objective:    Physical Exam  Constitutional: She is oriented to person, place, and time. She appears well-developed and well-nourished. No distress.  HENT:  Head: Normocephalic and atraumatic.  Eyes: Conjunctivae are normal.  Neck: Normal range of motion. No thyromegaly present.  Cardiovascular: Normal rate and regular rhythm.   Murmur heard. Pulmonary/Chest: Effort normal and breath sounds normal. She has no wheezes.  Abdominal: Soft. Bowel sounds are normal. There is no tenderness.  Musculoskeletal: Normal range of motion. She exhibits no edema or deformity.  Neurological: She is alert and oriented to person, place, and time.  Skin: Skin is warm and dry. She is not diaphoretic.  Psychiatric: She has a normal mood and affect.    BP 130/84 (BP Location: Left Arm, Patient Position: Sitting, Cuff Size: Normal)   Pulse 84   Temp 98 F (36.7 C) (Oral)   Resp 18   Wt 135 lb 9.6 oz (61.5 kg)   SpO2 96%   BMI 22.57 kg/m  Wt Readings from Last 3 Encounters:  10/09/16 135 lb 9.6 oz (61.5 kg)  01/10/16 137 lb 6 oz (62.3 kg)  06/07/15 137 lb 4 oz (62.3 kg)   BP Readings from Last 3 Encounters:  10/09/16 130/84  01/10/16 138/70  06/07/15 124/68     Immunization History  Administered Date(s) Administered  . Influenza Split 03/30/2011, 04/29/2012  . Influenza Whole 05/24/2010  . Influenza, High Dose Seasonal PF 05/19/2013  . Influenza,inj,Quad PF,36+ Mos 04/22/2014  . Influenza-Unspecified 04/01/2015, 04/14/2016  . Pneumococcal Conjugate-13 05/19/2013  . Pneumococcal Polysaccharide-23 05/24/2015  . Tdap 05/28/2013    Health Maintenance  Topic Date Due  . MAMMOGRAM  10/30/2016  . TETANUS/TDAP  05/29/2023  . INFLUENZA VACCINE  Completed  . DEXA SCAN  Completed  . PNA vac Low Risk Adult  Completed    Lab Results  Component Value Date   WBC 7.9 10/09/2016   HGB 13.5 10/09/2016   HCT 41.4 10/09/2016   PLT 232.0 10/09/2016   GLUCOSE  100 (H) 10/09/2016   CHOL 150 10/09/2016   TRIG 162.0 (H) 10/09/2016   HDL 47.90 10/09/2016   LDLCALC 69 10/09/2016   ALT 16 10/09/2016   AST 17 10/09/2016   NA 141 10/09/2016   K 4.4 10/09/2016   CL 104 10/09/2016   CREATININE 0.71 10/09/2016   BUN 21 10/09/2016   CO2 31 10/09/2016   TSH 4.86 (H) 10/09/2016   HGBA1C 5.9 10/09/2016    Lab Results  Component Value Date   TSH 4.86 (H) 10/09/2016   Lab Results  Component Value Date   WBC 7.9 10/09/2016   HGB 13.5 10/09/2016   HCT 41.4 10/09/2016   MCV 93.6 10/09/2016   PLT 232.0 10/09/2016   Lab Results  Component Value Date   NA 141 10/09/2016   K 4.4 10/09/2016   CO2 31 10/09/2016   GLUCOSE 100 (H) 10/09/2016   BUN 21 10/09/2016   CREATININE 0.71 10/09/2016   BILITOT 0.3 10/09/2016   ALKPHOS 53 10/09/2016   AST 17 10/09/2016   ALT 16 10/09/2016   PROT 7.2 10/09/2016   ALBUMIN 4.4 10/09/2016   CALCIUM 10.2 10/09/2016   GFR 82.03 10/09/2016   Lab Results  Component Value Date   CHOL 150 10/09/2016   Lab Results  Component Value Date   HDL 47.90 10/09/2016   Lab Results  Component Value Date   LDLCALC 69 10/09/2016   Lab Results  Component Value Date   TRIG 162.0 (H) 10/09/2016   Lab Results  Component Value Date   CHOLHDL 3 10/09/2016   Lab Results  Component Value Date   HGBA1C 5.9 10/09/2016         Assessment & Plan:   Problem List Items Addressed This Visit    Vitamin D deficiency    Level checked today is WNL, no changes      Relevant Orders   Vitamin D 1,25 dihydroxy (Completed)   MIXED HYPERLIPIDEMIA - Primary    Encouraged heart healthy diet, increase exercise, avoid trans fats, consider a krill oil cap daily      Relevant Medications   amLODipine (NORVASC) 5 MG tablet   Other Relevant Orders   Lipid panel (Completed)   ESSENTIAL HYPERTENSION, BENIGN    Well controlled, no changes to meds. Encouraged heart healthy diet such as the DASH diet and exercise as tolerated.         Relevant Medications   amLODipine (NORVASC) 5 MG tablet   Other Relevant Orders   CBC (Completed)   Comprehensive metabolic panel (Completed)   TSH (Completed)   Osteoporosis    Encouraged to get adequate exercise, calcium and vitamin d intake      Disorder of vitamin B12    Vitamin B12 is elevated advised to decrease doses per week by 2      Relevant Orders   Vitamin B12 (Completed)   Preventative health care   SOB (shortness of breath)    With fatigue and worsening heart murmur, will proceed with Echo to further investigate and she is encouraged to stay as active as tolerated and to seek care if it worsens.       Relevant Orders   ECHOCARDIOGRAM COMPLETE   Hyperglycemia    hgba1c acceptable, minimize simple carbs. Increase exercise as tolerated.       Relevant Orders   Hemoglobin A1c (Completed)    Other Visit Diagnoses    Fatigue, unspecified type       Relevant Orders   ECHOCARDIOGRAM COMPLETE   Encounter for Medicare annual wellness exam          I have discontinued Ms. Seely's diltiazem and diltiazem. I am also having her maintain her fish oil-omega-3 fatty acids, calcium carbonate, hydrochlorothiazide, valsartan, atorvastatin, and amLODipine.  Meds ordered this encounter  Medications  . DISCONTD: diltiazem (DILT-XR) 120 MG 24 hr capsule    Sig: Take 1 capsule (120 mg total) by mouth daily.    Dispense:  90 capsule    Refill:  1  . DISCONTD: amLODipine (NORVASC) 5 MG tablet    Sig: Take 1 tablet (5 mg total) by mouth daily.    Dispense:  90 tablet    Refill:  3  . amLODipine (NORVASC) 5 MG tablet  Sig: Take 1 tablet (5 mg total) by mouth daily.    Dispense:  90 tablet    Refill:  3    CMA served as scribe during this visit. History, Physical and Plan performed by medical provider. Documentation and orders reviewed and attested to.  Penni Homans, MD

## 2016-10-13 NOTE — Assessment & Plan Note (Deleted)
Patient encouraged to maintain heart healthy diet, regular exercise, adequate sleep. Consider daily probiotics. Take medications as prescribed 

## 2016-10-15 ENCOUNTER — Other Ambulatory Visit (INDEPENDENT_AMBULATORY_CARE_PROVIDER_SITE_OTHER): Payer: Medicare Other

## 2016-10-15 ENCOUNTER — Other Ambulatory Visit: Payer: Medicare Other

## 2016-10-15 DIAGNOSIS — R946 Abnormal results of thyroid function studies: Secondary | ICD-10-CM

## 2016-10-15 DIAGNOSIS — R7989 Other specified abnormal findings of blood chemistry: Secondary | ICD-10-CM

## 2016-10-15 LAB — T4, FREE: FREE T4: 0.81 ng/dL (ref 0.60–1.60)

## 2016-10-15 LAB — T3, FREE: T3 FREE: 3.8 pg/mL (ref 2.3–4.2)

## 2016-10-17 ENCOUNTER — Telehealth: Payer: Self-pay | Admitting: Family Medicine

## 2016-10-17 NOTE — Telephone Encounter (Signed)
Caller name: Relationship to patient: Self Can be reached: [phone]  Pharmacy:  [facility], [place] [phone] (Phone) [phone] (Fax)     Reason for call: Request call back about amLODipine (NORVASC) 5 MG tablet. States pharmacy is trying to contact provider.

## 2016-10-18 NOTE — Telephone Encounter (Signed)
Done already

## 2016-10-21 ENCOUNTER — Other Ambulatory Visit: Payer: Self-pay | Admitting: Family Medicine

## 2016-10-22 ENCOUNTER — Other Ambulatory Visit: Payer: Self-pay | Admitting: Family Medicine

## 2016-10-22 DIAGNOSIS — R5383 Other fatigue: Secondary | ICD-10-CM

## 2016-10-30 ENCOUNTER — Ambulatory Visit (HOSPITAL_COMMUNITY): Payer: Medicare Other | Attending: Cardiovascular Disease

## 2016-10-30 ENCOUNTER — Other Ambulatory Visit: Payer: Self-pay

## 2016-10-30 DIAGNOSIS — E785 Hyperlipidemia, unspecified: Secondary | ICD-10-CM | POA: Diagnosis not present

## 2016-10-30 DIAGNOSIS — R5383 Other fatigue: Secondary | ICD-10-CM | POA: Diagnosis not present

## 2016-10-30 DIAGNOSIS — R0602 Shortness of breath: Secondary | ICD-10-CM | POA: Diagnosis not present

## 2016-10-30 DIAGNOSIS — I082 Rheumatic disorders of both aortic and tricuspid valves: Secondary | ICD-10-CM | POA: Diagnosis not present

## 2016-10-30 DIAGNOSIS — I10 Essential (primary) hypertension: Secondary | ICD-10-CM | POA: Diagnosis not present

## 2016-11-06 LAB — HM MAMMOGRAPHY

## 2016-11-20 ENCOUNTER — Encounter: Payer: Self-pay | Admitting: Family Medicine

## 2017-01-15 ENCOUNTER — Other Ambulatory Visit: Payer: Medicare Other

## 2017-01-17 ENCOUNTER — Other Ambulatory Visit: Payer: Self-pay | Admitting: Family Medicine

## 2017-01-22 ENCOUNTER — Other Ambulatory Visit (INDEPENDENT_AMBULATORY_CARE_PROVIDER_SITE_OTHER): Payer: Medicare Other

## 2017-01-22 DIAGNOSIS — R5383 Other fatigue: Secondary | ICD-10-CM | POA: Diagnosis not present

## 2017-01-22 LAB — T3, FREE: T3, Free: 2.7 pg/mL (ref 2.3–4.2)

## 2017-01-22 LAB — T4, FREE: FREE T4: 0.68 ng/dL (ref 0.60–1.60)

## 2017-01-22 LAB — TSH: TSH: 2.08 u[IU]/mL (ref 0.35–4.50)

## 2017-03-06 ENCOUNTER — Telehealth: Payer: Self-pay | Admitting: Family Medicine

## 2017-03-06 MED ORDER — LISINOPRIL 20 MG PO TABS: 20.0000 mg | ORAL_TABLET | Freq: Every day | ORAL | 1 refills | Status: DC

## 2017-03-06 MED ORDER — LISINOPRIL 20 MG PO TABS: 20.0000 mg | ORAL_TABLET | Freq: Every day | ORAL | 3 refills | Status: DC

## 2017-03-06 NOTE — Telephone Encounter (Signed)
Pt called in stating she is very disappointed in the inaction taken with her medication. She states that she brought a letter into the office 03/04/17 explaining the recall and that a new RX has been requested by [facility]. Pt states her son is a doctor and has suggested Lisinopril as an alternate medication. Pt is requesting an Urgent response today by Dr. Charlett Blake or covering MD. Pt would like a response to her via myChart.

## 2017-03-06 NOTE — Telephone Encounter (Signed)
Relation to OL:IDCV Call back number:[phone] Pharmacy: [facility], [place] [phone] (Phone) [phone] (Fax)     Reason for call:  Patient states she received a letter regarding valsartan (DIOVAN) 160 MG tablet recall, requesting alternate will call back with a medication she would like to try,please advise

## 2017-03-06 NOTE — Telephone Encounter (Signed)
Thanks Lisinopril is a good choice, I have not sen a letter.

## 2017-03-06 NOTE — Telephone Encounter (Signed)
Dr. Salvatore Marvel advise in Dr. Charlett Blake absence.

## 2017-03-06 NOTE — Telephone Encounter (Signed)
Called pt back- Lisinopril is a fine idea. Will have her start on 20 mg of lisinopril daily. Sent a 30 day to local pharmacy and 90 day to [facility]. Stop valsartan. She expresses understanding and apprciation

## 2017-04-01 ENCOUNTER — Telehealth: Payer: Self-pay | Admitting: Family Medicine

## 2017-04-01 NOTE — Telephone Encounter (Signed)
Called patient about having tdap done. Left message for patient to call back.    PC

## 2017-04-01 NOTE — Telephone Encounter (Signed)
Patient requesting tetanus with pertussis, please advise.   PC

## 2017-04-01 NOTE — Telephone Encounter (Signed)
I would be happy to give her a Tdap but we should warn her insurance is often the problem here. Have her check with insurance and confirm they will pay for it and if so is it cheaper for her to get it here or at the pharmacy. Then if she wants it here set her up for nurse visit to get immunization completed.

## 2017-04-01 NOTE — Telephone Encounter (Signed)
Relation to PY:PPJK Call back number:[phone]   Reason for call:  Patient requesting tetanus orders with the "profession" vaccination due to grand baby being born, please advise

## 2017-04-01 NOTE — Telephone Encounter (Signed)
Patient just wanted to verify that she has had the shot. I looked into her immunizations and she has had the vaccine. Patient will be due for next tetanus in 2024  [facility]

## 2017-04-02 NOTE — Telephone Encounter (Signed)
LMOVM stating the date she had Tdap and when the next Td is due in 2024/thx dmf

## 2017-04-11 ENCOUNTER — Encounter: Payer: Self-pay | Admitting: Family Medicine

## 2017-04-11 ENCOUNTER — Ambulatory Visit (INDEPENDENT_AMBULATORY_CARE_PROVIDER_SITE_OTHER): Payer: Medicare Other | Admitting: Family Medicine

## 2017-04-11 DIAGNOSIS — E538 Deficiency of other specified B group vitamins: Secondary | ICD-10-CM | POA: Diagnosis not present

## 2017-04-11 DIAGNOSIS — I10 Essential (primary) hypertension: Secondary | ICD-10-CM | POA: Diagnosis not present

## 2017-04-11 DIAGNOSIS — E559 Vitamin D deficiency, unspecified: Secondary | ICD-10-CM | POA: Diagnosis not present

## 2017-04-11 DIAGNOSIS — R739 Hyperglycemia, unspecified: Secondary | ICD-10-CM

## 2017-04-11 DIAGNOSIS — G4700 Insomnia, unspecified: Secondary | ICD-10-CM | POA: Diagnosis not present

## 2017-04-11 DIAGNOSIS — E782 Mixed hyperlipidemia: Secondary | ICD-10-CM

## 2017-04-11 HISTORY — DX: Insomnia, unspecified: G47.00

## 2017-04-11 LAB — CBC
HCT: 40.3 % (ref 36.0–46.0)
Hemoglobin: 13.1 g/dL (ref 12.0–15.0)
MCHC: 32.4 g/dL (ref 30.0–36.0)
MCV: 93.6 fl (ref 78.0–100.0)
Platelets: 235 10*3/uL (ref 150.0–400.0)
RBC: 4.3 Mil/uL (ref 3.87–5.11)
RDW: 14 % (ref 11.5–15.5)
WBC: 6.8 10*3/uL (ref 4.0–10.5)

## 2017-04-11 LAB — LIPID PANEL
CHOLESTEROL: 206 mg/dL — AB (ref 0–200)
HDL: 44 mg/dL (ref >39.00)
LDL CALC: 126 mg/dL — AB (ref 0–99)
NonHDL: 162.35
TRIGLYCERIDES: 181 mg/dL — AB (ref 0.0–149.0)
Total CHOL/HDL Ratio: 5
VLDL: 36.2 mg/dL (ref 0.0–40.0)

## 2017-04-11 LAB — COMPREHENSIVE METABOLIC PANEL
ALBUMIN: 4.1 g/dL (ref 3.5–5.2)
ALK PHOS: 48 U/L (ref 39–117)
ALT: 10 U/L (ref 0–35)
AST: 12 U/L (ref 0–37)
BILIRUBIN TOTAL: 0.4 mg/dL (ref 0.2–1.2)
BUN: 18 mg/dL (ref 6–23)
CALCIUM: 9.9 mg/dL (ref 8.4–10.5)
CHLORIDE: 104 meq/L (ref 96–112)
CO2: 32 mEq/L (ref 19–32)
CREATININE: 0.78 mg/dL (ref 0.40–1.20)
GFR: 73.51 mL/min (ref >60.00)
Glucose, Bld: 113 mg/dL — ABNORMAL HIGH (ref 70–99)
Potassium: 3.6 mEq/L (ref 3.5–5.1)
Sodium: 141 mEq/L (ref 135–145)
TOTAL PROTEIN: 6.7 g/dL (ref 6.0–8.3)

## 2017-04-11 LAB — HEMOGLOBIN A1C: HEMOGLOBIN A1C: 5.9 % (ref 4.6–6.5)

## 2017-04-11 LAB — VITAMIN B12: VITAMIN B 12: 553 pg/mL (ref 211–911)

## 2017-04-11 NOTE — Assessment & Plan Note (Signed)
High on last draw no supplements now will recheck

## 2017-04-11 NOTE — Patient Instructions (Addendum)
Shingrix is the new shingles shot can get at pharmacy. 2 shots over 2-6 months. Melatonin 2- 10 mg or L Tryptophan capsule ([place]) for sleep   Encouraged good sleep hygiene such as dark, quiet room. No blue/green glowing lights such as computer screens in bedroom. No alcohol or stimulants in evening. Cut down on caffeine as able. Regular exercise is helpful but not just prior to bed time.    Hypertension Hypertension, commonly called high blood pressure, is when the force of blood pumping through the arteries is too strong. The arteries are the blood vessels that carry blood from the heart throughout the body. Hypertension forces the heart to work harder to pump blood and may cause arteries to become narrow or stiff. Having untreated or uncontrolled hypertension can cause heart attacks, strokes, kidney disease, and other problems. A blood pressure reading consists of a higher number over a lower number. Ideally, your blood pressure should be below 120/80. The first ("top") number is called the systolic pressure. It is a measure of the pressure in your arteries as your heart beats. The second ("bottom") number is called the diastolic pressure. It is a measure of the pressure in your arteries as the heart relaxes. What are the causes? The cause of this condition is not known. What increases the risk? Some risk factors for high blood pressure are under your control. Others are not. Factors you can change  Smoking.  Having type 2 diabetes mellitus, high cholesterol, or both.  Not getting enough exercise or physical activity.  Being overweight.  Having too much fat, sugar, calories, or salt (sodium) in your diet.  Drinking too much alcohol. Factors that are difficult or impossible to change  Having chronic kidney disease.  Having a family history of high blood pressure.  Age. Risk increases with age.  Race. You may be at higher risk if you are African-American.  Gender. Men are at  higher risk than women before age 46. After age 69, women are at higher risk than men.  Having obstructive sleep apnea.  Stress. What are the signs or symptoms? Extremely high blood pressure (hypertensive crisis) may cause:  Headache.  Anxiety.  Shortness of breath.  Nosebleed.  Nausea and vomiting.  Severe chest pain.  Jerky movements you cannot control (seizures).  How is this diagnosed? This condition is diagnosed by measuring your blood pressure while you are seated, with your arm resting on a surface. The cuff of the blood pressure monitor will be placed directly against the skin of your upper arm at the level of your heart. It should be measured at least twice using the same arm. Certain conditions can cause a difference in blood pressure between your right and left arms. Certain factors can cause blood pressure readings to be lower or higher than normal (elevated) for a short period of time:  When your blood pressure is higher when you are in a health care provider's office than when you are at home, this is called white coat hypertension. Most people with this condition do not need medicines.  When your blood pressure is higher at home than when you are in a health care provider's office, this is called masked hypertension. Most people with this condition may need medicines to control blood pressure.  If you have a high blood pressure reading during one visit or you have normal blood pressure with other risk factors:  You may be asked to return on a different day to have your blood pressure  checked again.  You may be asked to monitor your blood pressure at home for 1 week or longer.  If you are diagnosed with hypertension, you may have other blood or imaging tests to help your health care provider understand your overall risk for other conditions. How is this treated? This condition is treated by making healthy lifestyle changes, such as eating healthy foods, exercising  more, and reducing your alcohol intake. Your health care provider may prescribe medicine if lifestyle changes are not enough to get your blood pressure under control, and if:  Your systolic blood pressure is above 130.  Your diastolic blood pressure is above 80.  Your personal target blood pressure may vary depending on your medical conditions, your age, and other factors. Follow these instructions at home: Eating and drinking  Eat a diet that is high in fiber and potassium, and low in sodium, added sugar, and fat. An example eating plan is called the DASH (Dietary Approaches to Stop Hypertension) diet. To eat this way: ? Eat plenty of fresh fruits and vegetables. Try to fill half of your plate at each meal with fruits and vegetables. ? Eat whole grains, such as whole wheat pasta, brown rice, or whole grain bread. Fill about one quarter of your plate with whole grains. ? Eat or drink low-fat dairy products, such as skim milk or low-fat yogurt. ? Avoid fatty cuts of meat, processed or cured meats, and poultry with skin. Fill about one quarter of your plate with lean proteins, such as fish, chicken without skin, beans, eggs, and tofu. ? Avoid premade and processed foods. These tend to be higher in sodium, added sugar, and fat.  Reduce your daily sodium intake. Most people with hypertension should eat less than 1,500 mg of sodium a day.  Limit alcohol intake to no more than 1 drink a day for nonpregnant women and 2 drinks a day for men. One drink equals 12 oz of beer, 5 oz of wine, or 1 oz of hard liquor. Lifestyle  Work with your health care provider to maintain a healthy body weight or to lose weight. Ask what an ideal weight is for you.  Get at least 30 minutes of exercise that causes your heart to beat faster (aerobic exercise) most days of the week. Activities may include walking, swimming, or biking.  Include exercise to strengthen your muscles (resistance exercise), such as pilates or  lifting weights, as part of your weekly exercise routine. Try to do these types of exercises for 30 minutes at least 3 days a week.  Do not use any products that contain nicotine or tobacco, such as cigarettes and e-cigarettes. If you need help quitting, ask your health care provider.  Monitor your blood pressure at home as told by your health care provider.  Keep all follow-up visits as told by your health care provider. This is important. Medicines  Take over-the-counter and prescription medicines only as told by your health care provider. Follow directions carefully. Blood pressure medicines must be taken as prescribed.  Do not skip doses of blood pressure medicine. Doing this puts you at risk for problems and can make the medicine less effective.  Ask your health care provider about side effects or reactions to medicines that you should watch for. Contact a health care provider if:  You think you are having a reaction to a medicine you are taking.  You have headaches that keep coming back (recurring).  You feel dizzy.  You have swelling in  your ankles.  You have trouble with your vision. Get help right away if:  You develop a severe headache or confusion.  You have unusual weakness or numbness.  You feel faint.  You have severe pain in your chest or abdomen.  You vomit repeatedly.  You have trouble breathing. Summary  Hypertension is when the force of blood pumping through your arteries is too strong. If this condition is not controlled, it may put you at risk for serious complications.  Your personal target blood pressure may vary depending on your medical conditions, your age, and other factors. For most people, a normal blood pressure is less than 120/80.  Hypertension is treated with lifestyle changes, medicines, or a combination of both. Lifestyle changes include weight loss, eating a healthy, low-sodium diet, exercising more, and limiting alcohol. This  information is not intended to replace advice given to you by your health care provider. Make sure you discuss any questions you have with your health care provider. Document Released: 07/02/2005 Document Revised: 05/30/2016 Document Reviewed: 05/30/2016 Elsevier Interactive Patient Education  [employer].

## 2017-04-11 NOTE — Assessment & Plan Note (Signed)
Well controlled, no changes to meds. Encouraged heart healthy diet such as the DASH diet and exercise as tolerated.  °

## 2017-04-11 NOTE — Assessment & Plan Note (Signed)
Encouraged heart healthy diet, increase exercise, avoid trans fats, consider a krill oil cap daily 

## 2017-04-11 NOTE — Progress Notes (Signed)
Subjective:  I acted as a Education administrator for Dr. Rogue Jury, CMA   Patient ID: Lori Bright, female    DOB: [birth year], 83 y.o.   MRN: [id number]  Chief Complaint  Patient presents with  . Follow-up  . Hyperlipidemia    HPI  Patient is in today for 6 month follow up appt. She is doing well. She has stopped her Atorvastatin due to her family members concerns. They are physicians and they were concerned it was affecting her sugars . No change in symptoms per patient since stopping. She continues to go to the gym 6 days a week and stay very active. Does note ongoing trouble with sleeping. Encouraged increased hydration, 64 ounces of clear fluids daily. Minimize alcohol and caffeine. Eat small frequent meals with lean proteins and complex carbs. Avoid high and low blood sugars. Get adequate sleep, 7-8 hours a night. Needs exercise daily preferably in the morning.  Patient Care Team: Mosie Lukes, MD as PCP - General   Past Medical History:  Diagnosis Date  . Anemia 11/28/2011  . Annual physical exam 05/24/2013  . Cervical cancer screening 04/03/2011  . Depression 04/25/2014  . Disorder of vitamin B12 10/09/2016  . Hyperlipidemia 11/28/2011  . Insomnia 04/11/2017  . Medicare annual wellness visit, subsequent 05/24/2013  . Parotiditis 10/18/2012  . Urinary incontinence 11/22/2013  . Wrist pain, right 04/03/2011    Past Surgical History:  Procedure Laterality Date  . CATARACT EXTRACTION     b/l  . HEMORRHOID SURGERY    . TONSILLECTOMY      Family History  Problem Relation Age of Onset  . Hip fracture Mother 51  . Heart disease Mother        w/ mitral valve replacement  . Obesity Mother   . Cancer Father        hepatic/ cigar smoker  . Cancer Brother        esophageal/ smoker  . Glaucoma Brother   . Other Daughter        early hysterectomy for menometroraghia  . Deep vein thrombosis Son   . Hip fracture Maternal Grandmother   . Other Maternal Grandmother        bladder prolapse  .  Hip fracture Paternal Grandfather   . Glaucoma Paternal Grandfather   . Deep vein thrombosis Son     Social History   Social History  . Marital status: Married    Spouse name: N/A  . Number of children: N/A  . Years of education: N/A   Occupational History  . Not on file.   Social History Main Topics  . Smoking status: Never Smoker  . Smokeless tobacco: Never Used  . Alcohol use Not on file     Comment: rare  . Drug use: No  . Sexual activity: Not on file   Other Topics Concern  . Not on file   Social History Narrative  . No narrative on file    Outpatient Medications Prior to Visit  Medication Sig Dispense Refill  . amLODipine (NORVASC) 5 MG tablet Take 1 tablet (5 mg total) by mouth daily. 90 tablet 3  . fish oil-omega-3 fatty acids 1000 MG capsule Take 1 g by mouth daily.     . hydrochlorothiazide (MICROZIDE) 12.5 MG capsule TAKE 1 CAPSULE DAILY 90 capsule 3  . lisinopril (PRINIVIL,ZESTRIL) 20 MG tablet Take 1 tablet (20 mg total) by mouth daily. 90 tablet 3  . atorvastatin (LIPITOR) 10 MG tablet TAKE 1 TABLET DAILY  90 tablet 1  . calcium carbonate (OS-CAL) 600 MG TABS Take 1,200 mg by mouth daily with breakfast.     . lisinopril (PRINIVIL,ZESTRIL) 20 MG tablet Take 1 tablet (20 mg total) by mouth daily. 30 tablet 1   No facility-administered medications prior to visit.     No Known Allergies  Review of Systems  Constitutional: Negative for fever and malaise/fatigue.  HENT: Negative for congestion.   Eyes: Negative for blurred vision.  Respiratory: Negative for shortness of breath.   Cardiovascular: Negative for chest pain, palpitations and leg swelling.  Gastrointestinal: Negative for abdominal pain, blood in stool and nausea.  Genitourinary: Negative for dysuria and frequency.  Musculoskeletal: Negative for falls.  Skin: Negative for rash.  Neurological: Negative for dizziness, loss of consciousness and headaches.  Endo/Heme/Allergies: Negative for  environmental allergies.  Psychiatric/Behavioral: Negative for depression. The patient is not nervous/anxious.        Objective:    Physical Exam  Constitutional: She is oriented to person, place, and time. She appears well-developed and well-nourished. No distress.  HENT:  Head: Normocephalic and atraumatic.  Nose: Nose normal.  Eyes: Right eye exhibits no discharge. Left eye exhibits no discharge.  Neck: Normal range of motion. Neck supple.  Cardiovascular: Normal rate and regular rhythm.   No murmur heard. Pulmonary/Chest: Effort normal and breath sounds normal.  Abdominal: Soft. Bowel sounds are normal. There is no tenderness.  Musculoskeletal: She exhibits no edema.  Neurological: She is alert and oriented to person, place, and time.  Skin: Skin is warm and dry.  Psychiatric: She has a normal mood and affect.  Nursing note and vitals reviewed.   BP 138/66   Pulse 84   Temp 98 F (36.7 C) (Oral)   Ht 5\' 4"  (1.626 m)   Wt 132 lb (59.9 kg)   SpO2 98%   BMI 22.66 kg/m  Wt Readings from Last 3 Encounters:  04/11/17 132 lb (59.9 kg)  10/09/16 135 lb 9.6 oz (61.5 kg)  01/10/16 137 lb 6 oz (62.3 kg)   BP Readings from Last 3 Encounters:  04/11/17 138/66  10/09/16 130/84  01/10/16 138/70     Immunization History  Administered Date(s) Administered  . Influenza Split 03/30/2011, 04/29/2012  . Influenza Whole 05/24/2010  . Influenza, High Dose Seasonal PF 05/19/2013  . Influenza,inj,Quad PF,6+ Mos 04/22/2014  . Influenza-Unspecified 04/01/2015, 04/14/2016, 03/06/2017  . Pneumococcal Conjugate-13 05/19/2013  . Pneumococcal Polysaccharide-23 05/24/2015  . Tdap 05/28/2013    Health Maintenance  Topic Date Due  . MAMMOGRAM  11/06/2017  . TETANUS/TDAP  05/29/2023  . INFLUENZA VACCINE  Completed  . DEXA SCAN  Completed  . PNA vac Low Risk Adult  Completed    Lab Results  Component Value Date   WBC 7.9 10/09/2016   HGB 13.5 10/09/2016   HCT 41.4 10/09/2016    PLT 232.0 10/09/2016   GLUCOSE 100 (H) 10/09/2016   CHOL 150 10/09/2016   TRIG 162.0 (H) 10/09/2016   HDL 47.90 10/09/2016   LDLCALC 69 10/09/2016   ALT 16 10/09/2016   AST 17 10/09/2016   NA 141 10/09/2016   K 4.4 10/09/2016   CL 104 10/09/2016   CREATININE 0.71 10/09/2016   BUN 21 10/09/2016   CO2 31 10/09/2016   TSH 2.08 01/22/2017   HGBA1C 5.9 10/09/2016    Lab Results  Component Value Date   TSH 2.08 01/22/2017   Lab Results  Component Value Date   WBC 7.9 10/09/2016   HGB  13.5 10/09/2016   HCT 41.4 10/09/2016   MCV 93.6 10/09/2016   PLT 232.0 10/09/2016   Lab Results  Component Value Date   NA 141 10/09/2016   K 4.4 10/09/2016   CO2 31 10/09/2016   GLUCOSE 100 (H) 10/09/2016   BUN 21 10/09/2016   CREATININE 0.71 10/09/2016   BILITOT 0.3 10/09/2016   ALKPHOS 53 10/09/2016   AST 17 10/09/2016   ALT 16 10/09/2016   PROT 7.2 10/09/2016   ALBUMIN 4.4 10/09/2016   CALCIUM 10.2 10/09/2016   GFR 82.03 10/09/2016   Lab Results  Component Value Date   CHOL 150 10/09/2016   Lab Results  Component Value Date   HDL 47.90 10/09/2016   Lab Results  Component Value Date   LDLCALC 69 10/09/2016   Lab Results  Component Value Date   TRIG 162.0 (H) 10/09/2016   Lab Results  Component Value Date   CHOLHDL 3 10/09/2016   Lab Results  Component Value Date   HGBA1C 5.9 10/09/2016         Assessment & Plan:   Problem List Items Addressed This Visit    Vitamin D deficiency    Normal last draw      MIXED HYPERLIPIDEMIA    Encouraged heart healthy diet, increase exercise, avoid trans fats, consider a krill oil cap daily      Relevant Orders   Lipid panel   ESSENTIAL HYPERTENSION, BENIGN    Well controlled, no changes to meds. Encouraged heart healthy diet such as the DASH diet and exercise as tolerated.       Relevant Orders   Comprehensive metabolic panel   CBC   Disorder of vitamin B12    High on last draw no supplements now will recheck        Relevant Orders   Vitamin B12   Hyperglycemia    hgba1c acceptable, minimize simple carbs. Increase exercise as tolerated.       Relevant Orders   Hemoglobin A1c   Insomnia    Encouraged good sleep hygiene such as dark, quiet room. No blue/green glowing lights such as computer screens in bedroom. No alcohol or stimulants in evening. Cut down on caffeine as able. Regular exercise is helpful but not just prior to bed time.          I have discontinued Ms. Inniss's calcium carbonate and atorvastatin. I am also having her maintain her fish oil-omega-3 fatty acids, hydrochlorothiazide, amLODipine, and lisinopril.  Meds ordered this encounter  Medications  . DISCONTD: amLODipine (NORVASC) 5 MG tablet    Sig: Take by mouth.    CMA served as Education administrator during this visit. History, Physical and Plan performed by medical provider. Documentation and orders reviewed and attested to.  Penni Homans, MD

## 2017-04-11 NOTE — Assessment & Plan Note (Signed)
Encouraged good sleep hygiene such as dark, quiet room. No blue/green glowing lights such as computer screens in bedroom. No alcohol or stimulants in evening. Cut down on caffeine as able. Regular exercise is helpful but not just prior to bed time.  

## 2017-04-11 NOTE — Assessment & Plan Note (Signed)
Normal last draw

## 2017-04-11 NOTE — Assessment & Plan Note (Signed)
hgba1c acceptable, minimize simple carbs. Increase exercise as tolerated.  

## 2017-05-08 ENCOUNTER — Other Ambulatory Visit: Payer: Self-pay | Admitting: Family Medicine

## 2017-05-08 DIAGNOSIS — I10 Essential (primary) hypertension: Secondary | ICD-10-CM

## 2017-05-08 DIAGNOSIS — E782 Mixed hyperlipidemia: Secondary | ICD-10-CM

## 2017-09-24 ENCOUNTER — Other Ambulatory Visit: Payer: Self-pay | Admitting: Family Medicine

## 2017-10-01 ENCOUNTER — Encounter: Payer: Self-pay | Admitting: Family Medicine

## 2017-10-01 ENCOUNTER — Ambulatory Visit (INDEPENDENT_AMBULATORY_CARE_PROVIDER_SITE_OTHER): Payer: Medicare Other | Admitting: Family Medicine

## 2017-10-01 VITALS — BP 118/58 | HR 89 | Temp 98.3°F | Resp 18 | Wt 131.2 lb

## 2017-10-01 DIAGNOSIS — M810 Age-related osteoporosis without current pathological fracture: Secondary | ICD-10-CM | POA: Diagnosis not present

## 2017-10-01 DIAGNOSIS — E559 Vitamin D deficiency, unspecified: Secondary | ICD-10-CM | POA: Diagnosis not present

## 2017-10-01 DIAGNOSIS — L989 Disorder of the skin and subcutaneous tissue, unspecified: Secondary | ICD-10-CM | POA: Diagnosis not present

## 2017-10-01 DIAGNOSIS — R739 Hyperglycemia, unspecified: Secondary | ICD-10-CM

## 2017-10-01 DIAGNOSIS — I10 Essential (primary) hypertension: Secondary | ICD-10-CM

## 2017-10-01 DIAGNOSIS — G4700 Insomnia, unspecified: Secondary | ICD-10-CM

## 2017-10-01 DIAGNOSIS — E785 Hyperlipidemia, unspecified: Secondary | ICD-10-CM | POA: Diagnosis not present

## 2017-10-01 DIAGNOSIS — M1990 Unspecified osteoarthritis, unspecified site: Secondary | ICD-10-CM

## 2017-10-01 DIAGNOSIS — E782 Mixed hyperlipidemia: Secondary | ICD-10-CM

## 2017-10-01 LAB — CBC
HCT: 40.8 % (ref 36.0–46.0)
Hemoglobin: 13.6 g/dL (ref 12.0–15.0)
MCHC: 33.2 g/dL (ref 30.0–36.0)
MCV: 92.6 fl (ref 78.0–100.0)
PLATELETS: 250 10*3/uL (ref 150.0–400.0)
RBC: 4.41 Mil/uL (ref 3.87–5.11)
RDW: 13.7 % (ref 11.5–15.5)
WBC: 9.3 10*3/uL (ref 4.0–10.5)

## 2017-10-01 LAB — COMPREHENSIVE METABOLIC PANEL
ALT: 16 U/L (ref 0–35)
AST: 15 U/L (ref 0–37)
Albumin: 4.4 g/dL (ref 3.5–5.2)
Alkaline Phosphatase: 51 U/L (ref 39–117)
BUN: 28 mg/dL — ABNORMAL HIGH (ref 6–23)
CHLORIDE: 103 meq/L (ref 96–112)
CO2: 31 meq/L (ref 19–32)
CREATININE: 0.82 mg/dL (ref 0.40–1.20)
Calcium: 10.2 mg/dL (ref 8.4–10.5)
GFR: 69.32 mL/min (ref >60.00)
Glucose, Bld: 112 mg/dL — ABNORMAL HIGH (ref 70–99)
POTASSIUM: 4.1 meq/L (ref 3.5–5.1)
Sodium: 140 mEq/L (ref 135–145)
Total Bilirubin: 0.3 mg/dL (ref 0.2–1.2)
Total Protein: 7.1 g/dL (ref 6.0–8.3)

## 2017-10-01 LAB — LIPID PANEL
CHOLESTEROL: 195 mg/dL (ref 0–200)
HDL: 50.5 mg/dL (ref >39.00)
NONHDL: 144.02
Total CHOL/HDL Ratio: 4
Triglycerides: 204 mg/dL — ABNORMAL HIGH (ref 0.0–149.0)
VLDL: 40.8 mg/dL — ABNORMAL HIGH (ref 0.0–40.0)

## 2017-10-01 LAB — LDL CHOLESTEROL, DIRECT: Direct LDL: 125 mg/dL

## 2017-10-01 LAB — HEMOGLOBIN A1C: Hgb A1c MFr Bld: 6 % (ref 4.6–6.5)

## 2017-10-01 LAB — TSH: TSH: 4.7 u[IU]/mL — ABNORMAL HIGH (ref 0.35–4.50)

## 2017-10-01 NOTE — Progress Notes (Signed)
Subjective:  I acted as a Education administrator for Dr. Charlett Blake. Lori Bright, [place]  Patient ID: Lori Bright, female    DOB: 06/13/1924, 84 y.o.   MRN: [id number]  No chief complaint on file.   HPI  Patient is in today for a 6 month follow up and she continues to stay active and exercise with yoga and at the gym every day. She eats a heart healthy diet and has not had any major febrile illness recently. She has had some pot nasal drip and a cough at times but not fever or malaise. She does endorse insomnia and thus fatigue. Denies CP/palp/SOB/HA/congestion/fevers/GI or GU c/o. Taking meds as prescribed. She notes she has right shoulder stiffness and pain which improves with activity. No fall or injury.   Patient Care Team: Mosie Lukes, MD as PCP - General   Past Medical History:  Diagnosis Date  . Anemia 11/28/2011  . Annual physical exam 05/24/2013  . Cervical cancer screening 04/03/2011  . Depression 04/25/2014  . Disorder of vitamin B12 10/09/2016  . Hyperlipidemia 11/28/2011  . Insomnia 04/11/2017  . Medicare annual wellness visit, subsequent 05/24/2013  . Parotiditis 10/18/2012  . Urinary incontinence 11/22/2013  . Wrist pain, right 04/03/2011    Past Surgical History:  Procedure Laterality Date  . CATARACT EXTRACTION     b/l  . HEMORRHOID SURGERY    . TONSILLECTOMY      Family History  Problem Relation Age of Onset  . Hip fracture Mother 81  . Heart disease Mother        w/ mitral valve replacement  . Obesity Mother   . Cancer Father        hepatic/ cigar smoker  . Cancer Brother        esophageal/ smoker  . Glaucoma Brother   . Other Daughter        early hysterectomy for menometroraghia  . Deep vein thrombosis Son   . Hip fracture Maternal Grandmother   . Other Maternal Grandmother        bladder prolapse  . Hip fracture Paternal Grandfather   . Glaucoma Paternal Grandfather   . Deep vein thrombosis Son     Social History   Socioeconomic History  . Marital status: Married   Spouse name: Not on file  . Number of children: Not on file  . Years of education: Not on file  . Highest education level: Not on file  Social Needs  . Financial resource strain: Not on file  . Food insecurity - worry: Not on file  . Food insecurity - inability: Not on file  . Transportation needs - medical: Not on file  . Transportation needs - non-medical: Not on file  Occupational History  . Not on file  Tobacco Use  . Smoking status: Never Smoker  . Smokeless tobacco: Never Used  Substance and Sexual Activity  . Alcohol use: Not on file    Comment: rare  . Drug use: No  . Sexual activity: Not on file  Other Topics Concern  . Not on file  Social History Narrative  . Not on file    Outpatient Medications Prior to Visit  Medication Sig Dispense Refill  . amLODipine (NORVASC) 5 MG tablet TAKE 1 TABLET DAILY 90 tablet 3  . fish oil-omega-3 fatty acids 1000 MG capsule Take 1 g by mouth daily.     . hydrochlorothiazide (MICROZIDE) 12.5 MG capsule TAKE 1 CAPSULE DAILY 90 capsule 3  . lisinopril (PRINIVIL,ZESTRIL) 20 MG  tablet Take 1 tablet (20 mg total) by mouth daily. 90 tablet 3   No facility-administered medications prior to visit.     No Known Allergies  Review of Systems  Constitutional: Negative for fever and malaise/fatigue.  HENT: Negative for congestion.   Eyes: Negative for blurred vision.  Respiratory: Negative for shortness of breath.   Cardiovascular: Negative for chest pain, palpitations and leg swelling.  Gastrointestinal: Negative for abdominal pain, blood in stool and nausea.  Genitourinary: Negative for dysuria and frequency.  Musculoskeletal: Positive for joint pain. Negative for falls.  Skin: Negative for rash.  Neurological: Negative for dizziness, loss of consciousness and headaches.  Endo/Heme/Allergies: Negative for environmental allergies.  Psychiatric/Behavioral: Negative for depression. The patient has insomnia. The patient is not  nervous/anxious.        Objective:    Physical Exam  Constitutional: She is oriented to person, place, and time. She appears well-developed and well-nourished. No distress.  HENT:  Head: Normocephalic and atraumatic.  Nose: Nose normal.  Eyes: Right eye exhibits no discharge. Left eye exhibits no discharge.  Neck: Normal range of motion. Neck supple.  Cardiovascular: Normal rate and regular rhythm.  No murmur heard. Pulmonary/Chest: Effort normal and breath sounds normal.  Abdominal: Soft. Bowel sounds are normal. There is no tenderness.  Musculoskeletal: She exhibits no edema.  Neurological: She is alert and oriented to person, place, and time.  Skin: Skin is warm and dry.  Callous lateral 4th toe and medial 5th toe on right foot  Psychiatric: She has a normal mood and affect.  Nursing note and vitals reviewed.   BP (!) 118/58 (BP Location: Left Arm, Patient Position: Sitting, Cuff Size: Normal)   Pulse 89   Temp 98.3 F (36.8 C) (Oral)   Resp 18   Wt 131 lb 3.2 oz (59.5 kg)   SpO2 94%   BMI 22.52 kg/m  Wt Readings from Last 3 Encounters:  10/01/17 131 lb 3.2 oz (59.5 kg)  04/11/17 132 lb (59.9 kg)  10/09/16 135 lb 9.6 oz (61.5 kg)   BP Readings from Last 3 Encounters:  10/01/17 (!) 118/58  04/11/17 138/66  10/09/16 130/84     Immunization History  Administered Date(s) Administered  . Influenza Split 03/30/2011, 04/29/2012  . Influenza Whole 05/24/2010  . Influenza, High Dose Seasonal PF 05/19/2013  . Influenza,inj,Quad PF,6+ Mos 04/22/2014  . Influenza-Unspecified 04/01/2015, 04/14/2016, 03/06/2017  . Pneumococcal Conjugate-13 05/19/2013  . Pneumococcal Polysaccharide-23 05/24/2015  . Tdap 05/28/2013    Health Maintenance  Topic Date Due  . MAMMOGRAM  11/06/2017  . TETANUS/TDAP  05/29/2023  . INFLUENZA VACCINE  Completed  . DEXA SCAN  Completed  . PNA vac Low Risk Adult  Completed    Lab Results  Component Value Date   WBC 9.3 10/01/2017   HGB  13.6 10/01/2017   HCT 40.8 10/01/2017   PLT 250.0 10/01/2017   GLUCOSE 112 (H) 10/01/2017   CHOL 195 10/01/2017   TRIG 204.0 (H) 10/01/2017   HDL 50.50 10/01/2017   LDLDIRECT 125.0 10/01/2017   LDLCALC 126 (H) 04/11/2017   ALT 16 10/01/2017   AST 15 10/01/2017   NA 140 10/01/2017   K 4.1 10/01/2017   CL 103 10/01/2017   CREATININE 0.82 10/01/2017   BUN 28 (H) 10/01/2017   CO2 31 10/01/2017   TSH 4.70 (H) 10/01/2017   HGBA1C 6.0 10/01/2017    Lab Results  Component Value Date   TSH 4.70 (H) 10/01/2017   Lab Results  Component Value Date   WBC 9.3 10/01/2017   HGB 13.6 10/01/2017   HCT 40.8 10/01/2017   MCV 92.6 10/01/2017   PLT 250.0 10/01/2017   Lab Results  Component Value Date   NA 140 10/01/2017   K 4.1 10/01/2017   CO2 31 10/01/2017   GLUCOSE 112 (H) 10/01/2017   BUN 28 (H) 10/01/2017   CREATININE 0.82 10/01/2017   BILITOT 0.3 10/01/2017   ALKPHOS 51 10/01/2017   AST 15 10/01/2017   ALT 16 10/01/2017   PROT 7.1 10/01/2017   ALBUMIN 4.4 10/01/2017   CALCIUM 10.2 10/01/2017   GFR 69.32 10/01/2017   Lab Results  Component Value Date   CHOL 195 10/01/2017   Lab Results  Component Value Date   HDL 50.50 10/01/2017   Lab Results  Component Value Date   LDLCALC 126 (H) 04/11/2017   Lab Results  Component Value Date   TRIG 204.0 (H) 10/01/2017   Lab Results  Component Value Date   CHOLHDL 4 10/01/2017   Lab Results  Component Value Date   HGBA1C 6.0 10/01/2017         Assessment & Plan:   Problem List Items Addressed This Visit    Vitamin D deficiency    Levels wnl continue supplements      MIXED HYPERLIPIDEMIA    Encouraged heart healthy diet, increase exercise, avoid trans fats, consider a krill oil cap daily      ESSENTIAL HYPERTENSION, BENIGN    Well controlled, no changes to meds. Encouraged heart healthy diet such as the DASH diet and exercise as tolerated.       Osteoporosis    Encouraged to get adequate exercise,  calcium and vitamin d intake      Hyperglycemia - Primary    hgba1c acceptable, minimize simple carbs. Increase exercise as tolerated.      Relevant Orders   Hemoglobin A1c (Completed)   Insomnia    Encouraged good sleep hygiene such as dark, quiet room. No blue/green glowing lights such as computer screens in bedroom. No alcohol or stimulants in evening. Cut down on caffeine as able. Regular exercise is helpful but not just prior to bed time. Consider L tryptophan      Arthritis    right shoulder stiffness and pain, improves after she becomes active. C/W OA. She will let us know if she is interested in referral. For now stay active and consider topical treatments.        Other Visit Diagnoses    Essential hypertension       Relevant Orders   Comprehensive metabolic panel (Completed)   CBC (Completed)   TSH (Completed)   Hyperlipidemia, unspecified hyperlipidemia type       Relevant Orders   Lipid panel (Completed)   Skin lesion of foot       Relevant Orders   Ambulatory referral to Podiatry      I am having Sudie Bailey maintain her fish oil-omega-3 fatty acids, lisinopril, hydrochlorothiazide, and amLODipine.  No orders of the defined types were placed in this encounter.   CMA served as Education administrator during this visit. History, Physical and Plan performed by medical provider. Documentation and orders reviewed and attested to.  Penni Homans, MD

## 2017-10-01 NOTE — Patient Instructions (Addendum)
L Tryptophan capsules for sleep, warm milk with honey and vanilla and a cookie or cracker makes L tryptophan If no help then consider Unisom or tylenol PM Insomnia Insomnia is a sleep disorder that makes it difficult to fall asleep or to stay asleep. Insomnia can cause tiredness (fatigue), low energy, difficulty concentrating, mood swings, and poor performance at work or school. There are three different ways to classify insomnia:  Difficulty falling asleep.  Difficulty staying asleep.  Waking up too early in the morning.  Any type of insomnia can be long-term (chronic) or short-term (acute). Both are common. Short-term insomnia usually lasts for three months or less. Chronic insomnia occurs at least three times a week for longer than three months. What are the causes? Insomnia may be caused by another condition, situation, or substance, such as:  Anxiety.  Certain medicines.  Gastroesophageal reflux disease (GERD) or other gastrointestinal conditions.  Asthma or other breathing conditions.  Restless legs syndrome, sleep apnea, or other sleep disorders.  Chronic pain.  Menopause. This may include hot flashes.  Stroke.  Abuse of alcohol, tobacco, or illegal drugs.  Depression.  Caffeine.  Neurological disorders, such as Alzheimer disease.  An overactive thyroid (hyperthyroidism).  The cause of insomnia may not be known. What increases the risk? Risk factors for insomnia include:  Gender. Women are more commonly affected than men.  Age. Insomnia is more common as you get older.  Stress. This may involve your professional or personal life.  Income. Insomnia is more common in people with lower income.  Lack of exercise.  Irregular work schedule or night shifts.  Traveling between different time zones.  What are the signs or symptoms? If you have insomnia, trouble falling asleep or trouble staying asleep is the main symptom. This may lead to other symptoms,  such as:  Feeling fatigued.  Feeling nervous about going to sleep.  Not feeling rested in the morning.  Having trouble concentrating.  Feeling irritable, anxious, or depressed.  How is this treated? Treatment for insomnia depends on the cause. If your insomnia is caused by an underlying condition, treatment will focus on addressing the condition. Treatment may also include:  Medicines to help you sleep.  Counseling or therapy.  Lifestyle adjustments.  Follow these instructions at home:  Take medicines only as directed by your health care provider.  Keep regular sleeping and waking hours. Avoid naps.  Keep a sleep diary to help you and your health care provider figure out what could be causing your insomnia. Include: ? When you sleep. ? When you wake up during the night. ? How well you sleep. ? How rested you feel the next day. ? Any side effects of medicines you are taking. ? What you eat and drink.  Make your bedroom a comfortable place where it is easy to fall asleep: ? Put up shades or special blackout curtains to block light from outside. ? Use a white noise machine to block noise. ? Keep the temperature cool.  Exercise regularly as directed by your health care provider. Avoid exercising right before bedtime.  Use relaxation techniques to manage stress. Ask your health care provider to suggest some techniques that may work well for you. These may include: ? Breathing exercises. ? Routines to release muscle tension. ? Visualizing peaceful scenes.  Cut back on alcohol, caffeinated beverages, and cigarettes, especially close to bedtime. These can disrupt your sleep.  Do not overeat or eat spicy foods right before bedtime. This can lead to  digestive discomfort that can make it hard for you to sleep.  Limit screen use before bedtime. This includes: ? Watching TV. ? Using your smartphone, tablet, and computer.  Stick to a routine. This can help you fall asleep  faster. Try to do a quiet activity, brush your teeth, and go to bed at the same time each night.  Get out of bed if you are still awake after 15 minutes of trying to sleep. Keep the lights down, but try reading or doing a quiet activity. When you feel sleepy, go back to bed.  Make sure that you drive carefully. Avoid driving if you feel very sleepy.  Keep all follow-up appointments as directed by your health care provider. This is important. Contact a health care provider if:  You are tired throughout the day or have trouble in your daily routine due to sleepiness.  You continue to have sleep problems or your sleep problems get worse. Get help right away if:  You have serious thoughts about hurting yourself or someone else. This information is not intended to replace advice given to you by your health care provider. Make sure you discuss any questions you have with your health care provider. Document Released: 06/29/2000 Document Revised: 12/02/2015 Document Reviewed: 04/02/2014 Elsevier Interactive Patient Education  2018 [place] good sleep hygiene such as dark, quiet room. No blue/green glowing lights such as computer screens in bedroom. No alcohol or stimulants in evening. Cut down on caffeine as able. Regular exercise is helpful but not just prior to bed time.

## 2017-10-02 DIAGNOSIS — M1990 Unspecified osteoarthritis, unspecified site: Secondary | ICD-10-CM | POA: Insufficient documentation

## 2017-10-02 NOTE — Assessment & Plan Note (Signed)
hgba1c acceptable, minimize simple carbs. Increase exercise as tolerated.  

## 2017-10-02 NOTE — Assessment & Plan Note (Signed)
right shoulder stiffness and pain, improves after she becomes active. C/W OA. She will let us know if she is interested in referral. For now stay active and consider topical treatments.

## 2017-10-02 NOTE — Assessment & Plan Note (Signed)
Encouraged heart healthy diet, increase exercise, avoid trans fats, consider a krill oil cap daily 

## 2017-10-02 NOTE — Assessment & Plan Note (Signed)
Encouraged good sleep hygiene such as dark, quiet room. No blue/green glowing lights such as computer screens in bedroom. No alcohol or stimulants in evening. Cut down on caffeine as able. Regular exercise is helpful but not just prior to bed time. Consider L tryptophan

## 2017-10-02 NOTE — Assessment & Plan Note (Signed)
Levels wnl continue supplements

## 2017-10-02 NOTE — Assessment & Plan Note (Signed)
Encouraged to get adequate exercise, calcium and vitamin d intake 

## 2017-10-02 NOTE — Assessment & Plan Note (Signed)
Well controlled, no changes to meds. Encouraged heart healthy diet such as the DASH diet and exercise as tolerated.  °

## 2017-10-03 ENCOUNTER — Other Ambulatory Visit (INDEPENDENT_AMBULATORY_CARE_PROVIDER_SITE_OTHER): Payer: Medicare Other

## 2017-10-03 DIAGNOSIS — R7989 Other specified abnormal findings of blood chemistry: Secondary | ICD-10-CM | POA: Diagnosis not present

## 2017-10-03 LAB — T4, FREE: FREE T4: 0.77 ng/dL (ref 0.60–1.60)

## 2017-10-09 ENCOUNTER — Ambulatory Visit (INDEPENDENT_AMBULATORY_CARE_PROVIDER_SITE_OTHER): Payer: Medicare Other | Admitting: Podiatry

## 2017-10-09 ENCOUNTER — Encounter: Payer: Self-pay | Admitting: Podiatry

## 2017-10-09 ENCOUNTER — Ambulatory Visit (INDEPENDENT_AMBULATORY_CARE_PROVIDER_SITE_OTHER): Payer: Medicare Other

## 2017-10-09 VITALS — BP 132/59 | HR 90 | Resp 16

## 2017-10-09 DIAGNOSIS — M2041 Other hammer toe(s) (acquired), right foot: Secondary | ICD-10-CM

## 2017-10-09 DIAGNOSIS — D169 Benign neoplasm of bone and articular cartilage, unspecified: Secondary | ICD-10-CM

## 2017-10-10 NOTE — Progress Notes (Signed)
Subjective:   Patient ID: Lori Bright, female   DOB: 84 y.o.   MRN: [id number]   HPI Patient presents with a lot of pain in the inside of the right fifth digit is not sure if she has a bone spur but currently she is been trying to use medicine on it.  Patient does not smoke likes to be active   Review of Systems  All other systems reviewed and are negative.       Objective:  Physical Exam  Constitutional: She appears well-developed and well-nourished.  Cardiovascular: Intact distal pulses.  Pulmonary/Chest: Effort normal.  Musculoskeletal: Normal range of motion.  Neurological: She is alert.  Skin: Skin is warm.  Nursing note and vitals reviewed.   Neurovascular status found to be intact with muscle strength adequate range of motion within normal limits for her age.  Patient has an area of keratotic tissue on the inside of the fifth digit right that is painful when palpated but no area that I could trim or thick tissue formation.  Moderate rotation of the toe against the fourth toe and patient does have good digital perfusion     Assessment:  Exostosis fifth digit right with keratotic lesion that is minimal at the current time with rotation of the fifth and fourth toes     Plan:  H&P condition reviewed and I applied padding to the toe was explained exostectomy if symptoms persist.  Hopefully the padding to reduce the pressure and solve her problem  X-rays indicate rotation of the toe with probable osteochondral spur

## 2017-11-08 LAB — HM MAMMOGRAPHY

## 2017-11-15 ENCOUNTER — Encounter: Payer: Self-pay | Admitting: Family Medicine

## 2018-02-20 ENCOUNTER — Other Ambulatory Visit: Payer: Self-pay | Admitting: Family Medicine

## 2018-03-25 ENCOUNTER — Telehealth: Payer: Self-pay | Admitting: *Deleted

## 2018-03-25 NOTE — Telephone Encounter (Signed)
Received Medical records from [facility], Emi Belfast, M.D., forwarded to provider/SLS 09/10

## 2018-04-28 ENCOUNTER — Other Ambulatory Visit: Payer: Self-pay | Admitting: Family Medicine

## 2018-04-28 DIAGNOSIS — I10 Essential (primary) hypertension: Secondary | ICD-10-CM

## 2018-04-28 DIAGNOSIS — E782 Mixed hyperlipidemia: Secondary | ICD-10-CM

## 2018-06-06 ENCOUNTER — Emergency Department (HOSPITAL_COMMUNITY): Payer: Medicare Other

## 2018-06-06 ENCOUNTER — Other Ambulatory Visit: Payer: Self-pay

## 2018-06-06 ENCOUNTER — Encounter (HOSPITAL_COMMUNITY): Payer: Self-pay | Admitting: *Deleted

## 2018-06-06 ENCOUNTER — Inpatient Hospital Stay (HOSPITAL_COMMUNITY)
Admission: EM | Admit: 2018-06-06 | Discharge: 2018-06-10 | DRG: 054 | Disposition: A | Discharge status: Skilled Nursing Facility | Payer: Medicare Other | Attending: Family Medicine | Admitting: Family Medicine

## 2018-06-06 DIAGNOSIS — G4700 Insomnia, unspecified: Secondary | ICD-10-CM | POA: Diagnosis present

## 2018-06-06 DIAGNOSIS — R778 Other specified abnormalities of plasma proteins: Secondary | ICD-10-CM

## 2018-06-06 DIAGNOSIS — D320 Benign neoplasm of cerebral meninges: Secondary | ICD-10-CM | POA: Diagnosis not present

## 2018-06-06 DIAGNOSIS — R4182 Altered mental status, unspecified: Secondary | ICD-10-CM | POA: Diagnosis not present

## 2018-06-06 DIAGNOSIS — E785 Hyperlipidemia, unspecified: Secondary | ICD-10-CM | POA: Diagnosis present

## 2018-06-06 DIAGNOSIS — E559 Vitamin D deficiency, unspecified: Secondary | ICD-10-CM | POA: Diagnosis present

## 2018-06-06 DIAGNOSIS — Z6821 Body mass index (BMI) 21.0-21.9, adult: Secondary | ICD-10-CM

## 2018-06-06 DIAGNOSIS — E782 Mixed hyperlipidemia: Secondary | ICD-10-CM | POA: Diagnosis present

## 2018-06-06 DIAGNOSIS — M810 Age-related osteoporosis without current pathological fracture: Secondary | ICD-10-CM | POA: Diagnosis present

## 2018-06-06 DIAGNOSIS — Z9842 Cataract extraction status, left eye: Secondary | ICD-10-CM

## 2018-06-06 DIAGNOSIS — Z809 Family history of malignant neoplasm, unspecified: Secondary | ICD-10-CM

## 2018-06-06 DIAGNOSIS — Z8249 Family history of ischemic heart disease and other diseases of the circulatory system: Secondary | ICD-10-CM

## 2018-06-06 DIAGNOSIS — R2681 Unsteadiness on feet: Secondary | ICD-10-CM

## 2018-06-06 DIAGNOSIS — S065XAA Traumatic subdural hemorrhage with loss of consciousness status unknown, initial encounter: Secondary | ICD-10-CM | POA: Diagnosis present

## 2018-06-06 DIAGNOSIS — I10 Essential (primary) hypertension: Secondary | ICD-10-CM

## 2018-06-06 DIAGNOSIS — G9340 Encephalopathy, unspecified: Secondary | ICD-10-CM | POA: Diagnosis not present

## 2018-06-06 DIAGNOSIS — D329 Benign neoplasm of meninges, unspecified: Secondary | ICD-10-CM

## 2018-06-06 DIAGNOSIS — H905 Unspecified sensorineural hearing loss: Secondary | ICD-10-CM | POA: Diagnosis present

## 2018-06-06 DIAGNOSIS — R569 Unspecified convulsions: Secondary | ICD-10-CM | POA: Insufficient documentation

## 2018-06-06 DIAGNOSIS — Z83511 Family history of glaucoma: Secondary | ICD-10-CM

## 2018-06-06 DIAGNOSIS — F0390 Unspecified dementia without behavioral disturbance: Secondary | ICD-10-CM | POA: Diagnosis present

## 2018-06-06 DIAGNOSIS — E440 Moderate protein-calorie malnutrition: Secondary | ICD-10-CM | POA: Diagnosis present

## 2018-06-06 DIAGNOSIS — Z9841 Cataract extraction status, right eye: Secondary | ICD-10-CM

## 2018-06-06 DIAGNOSIS — R7989 Other specified abnormal findings of blood chemistry: Secondary | ICD-10-CM

## 2018-06-06 DIAGNOSIS — S065X9A Traumatic subdural hemorrhage with loss of consciousness of unspecified duration, initial encounter: Secondary | ICD-10-CM | POA: Diagnosis present

## 2018-06-06 DIAGNOSIS — G936 Cerebral edema: Secondary | ICD-10-CM

## 2018-06-06 LAB — COMPREHENSIVE METABOLIC PANEL
ALBUMIN: 4 g/dL (ref 3.5–5.0)
ALK PHOS: 53 U/L (ref 38–126)
ALT: 23 U/L (ref 0–44)
AST: 31 U/L (ref 15–41)
Anion gap: 8 (ref 5–15)
BILIRUBIN TOTAL: 0.6 mg/dL (ref 0.3–1.2)
BUN: 21 mg/dL (ref 8–23)
CALCIUM: 9.7 mg/dL (ref 8.9–10.3)
CO2: 28 mmol/L (ref 22–32)
Chloride: 105 mmol/L (ref 98–111)
Creatinine, Ser: 0.87 mg/dL (ref 0.44–1.00)
GFR calc Af Amer: >60 mL/min (ref >60)
GFR calc non Af Amer: 56 mL/min — ABNORMAL LOW (ref >60)
GLUCOSE: 136 mg/dL — AB (ref 70–99)
Potassium: 3.7 mmol/L (ref 3.5–5.1)
Sodium: 141 mmol/L (ref 135–145)
TOTAL PROTEIN: 7.1 g/dL (ref 6.5–8.1)

## 2018-06-06 LAB — CBC
HEMATOCRIT: 43.2 % (ref 36.0–46.0)
Hemoglobin: 12.9 g/dL (ref 12.0–15.0)
MCH: 29 pg (ref 26.0–34.0)
MCHC: 29.9 g/dL — ABNORMAL LOW (ref 30.0–36.0)
MCV: 97.1 fL (ref 80.0–100.0)
Platelets: 244 10*3/uL (ref 150–400)
RBC: 4.45 MIL/uL (ref 3.87–5.11)
RDW: 12.9 % (ref 11.5–15.5)
WBC: 14.9 10*3/uL — ABNORMAL HIGH (ref 4.0–10.5)
nRBC: 0 % (ref 0.0–0.2)

## 2018-06-06 LAB — CBC WITH DIFFERENTIAL/PLATELET
ABS IMMATURE GRANULOCYTES: 0.1 10*3/uL — AB (ref 0.00–0.07)
BASOS PCT: 0 %
Basophils Absolute: 0 10*3/uL (ref 0.0–0.1)
EOS PCT: 1 %
Eosinophils Absolute: 0.1 10*3/uL (ref 0.0–0.5)
HCT: 41.9 % (ref 36.0–46.0)
Hemoglobin: 13 g/dL (ref 12.0–15.0)
Immature Granulocytes: 1 %
LYMPHS PCT: 5 %
Lymphs Abs: 0.7 10*3/uL (ref 0.7–4.0)
MCH: 30 pg (ref 26.0–34.0)
MCHC: 31 g/dL (ref 30.0–36.0)
MCV: 96.5 fL (ref 80.0–100.0)
MONO ABS: 0.4 10*3/uL (ref 0.1–1.0)
Monocytes Relative: 3 %
NRBC: 0 % (ref 0.0–0.2)
Neutro Abs: 13.2 10*3/uL — ABNORMAL HIGH (ref 1.7–7.7)
Neutrophils Relative %: 90 %
PLATELETS: 232 10*3/uL (ref 150–400)
RBC: 4.34 MIL/uL (ref 3.87–5.11)
RDW: 12.9 % (ref 11.5–15.5)
WBC: 14.5 10*3/uL — AB (ref 4.0–10.5)

## 2018-06-06 LAB — URINALYSIS, MICROSCOPIC (REFLEX)

## 2018-06-06 LAB — I-STAT TROPONIN, ED: Troponin i, poc: 0.34 ng/mL (ref 0.00–0.08)

## 2018-06-06 LAB — URINALYSIS, ROUTINE W REFLEX MICROSCOPIC
Bilirubin Urine: NEGATIVE
GLUCOSE, UA: NEGATIVE mg/dL
KETONES UR: 15 mg/dL — AB
LEUKOCYTES UA: NEGATIVE
NITRITE: NEGATIVE
PH: 5.5 (ref 5.0–8.0)
PROTEIN: NEGATIVE mg/dL
Specific Gravity, Urine: >1.03 — ABNORMAL HIGH (ref 1.005–1.030)

## 2018-06-06 LAB — ETHANOL: Alcohol, Ethyl (B): <10 mg/dL (ref <10)

## 2018-06-06 LAB — RAPID URINE DRUG SCREEN, HOSP PERFORMED
AMPHETAMINES: NOT DETECTED
BARBITURATES: NOT DETECTED
BENZODIAZEPINES: NOT DETECTED
Cocaine: NOT DETECTED
Opiates: NOT DETECTED
Tetrahydrocannabinol: NOT DETECTED

## 2018-06-06 LAB — PROCALCITONIN: Procalcitonin: 0.13 ng/mL

## 2018-06-06 LAB — TROPONIN I: Troponin I: 0.73 ng/mL (ref <0.03)

## 2018-06-06 LAB — PROTIME-INR
INR: 1.05
PROTHROMBIN TIME: 13.6 s (ref 11.4–15.2)

## 2018-06-06 LAB — APTT: APTT: 21 s — AB (ref 24–36)

## 2018-06-06 MED ORDER — SODIUM CHLORIDE 0.9 % IV BOLUS (SEPSIS)
500.0000 mL | Freq: Once | INTRAVENOUS | Status: AC
  Administered 2018-06-06: 500 mL via INTRAVENOUS

## 2018-06-06 MED ORDER — LEVETIRACETAM IN NACL 500 MG/100ML IV SOLN
500.0000 mg | Freq: Two times a day (BID) | INTRAVENOUS | Status: DC
  Filled 2018-06-06: qty 100

## 2018-06-06 MED ORDER — ACETAMINOPHEN 650 MG RE SUPP: 650.0000 mg | Freq: Four times a day (QID) | RECTAL | Status: DC | PRN

## 2018-06-06 MED ORDER — DEXAMETHASONE SODIUM PHOSPHATE 10 MG/ML IJ SOLN
4.0000 mg | Freq: Four times a day (QID) | INTRAMUSCULAR | Status: DC
  Administered 2018-06-07 (×2): 4 mg via INTRAVENOUS
  Filled 2018-06-06 (×2): qty 1

## 2018-06-06 MED ORDER — ONDANSETRON HCL 4 MG/2ML IJ SOLN: 4.0000 mg | Freq: Four times a day (QID) | INTRAMUSCULAR | Status: DC | PRN

## 2018-06-06 MED ORDER — DEXAMETHASONE SODIUM PHOSPHATE 10 MG/ML IJ SOLN
10.0000 mg | Freq: Once | INTRAMUSCULAR | Status: AC
  Administered 2018-06-06: 10 mg via INTRAVENOUS
  Filled 2018-06-06: qty 1

## 2018-06-06 MED ORDER — AMLODIPINE BESYLATE 5 MG PO TABS
5.0000 mg | ORAL_TABLET | Freq: Every day | ORAL | Status: DC
  Administered 2018-06-07 – 2018-06-10 (×4): 5 mg via ORAL
  Filled 2018-06-06 (×4): qty 1

## 2018-06-06 MED ORDER — SODIUM CHLORIDE 0.9 % IV SOLN
1000.0000 mL | INTRAVENOUS | Status: DC
  Administered 2018-06-06 – 2018-06-07 (×2): 1000 mL via INTRAVENOUS

## 2018-06-06 MED ORDER — OMEGA-3-ACID ETHYL ESTERS 1 G PO CAPS
1.0000 g | ORAL_CAPSULE | Freq: Every day | ORAL | Status: DC
  Administered 2018-06-07 – 2018-06-10 (×4): 1 g via ORAL
  Filled 2018-06-06 (×4): qty 1

## 2018-06-06 MED ORDER — LEVETIRACETAM IN NACL 1000 MG/100ML IV SOLN
1000.0000 mg | Freq: Once | INTRAVENOUS | Status: AC
  Administered 2018-06-06: 1000 mg via INTRAVENOUS
  Filled 2018-06-06: qty 100

## 2018-06-06 MED ORDER — ACETAMINOPHEN 325 MG PO TABS
650.0000 mg | ORAL_TABLET | Freq: Four times a day (QID) | ORAL | Status: DC | PRN
  Administered 2018-06-09: 650 mg via ORAL
  Filled 2018-06-06: qty 2

## 2018-06-06 NOTE — Progress Notes (Signed)
CRITICAL VALUE STICKER  CRITICAL VALUE: Troponin 0.73  RECEIVER (on-site recipient of call): Greig Right, RN  DATE & TIME NOTIFIED: 2242 06/06/18  MESSENGER (representative from lab):  MD NOTIFIED:   [place]  TIME OF NOTIFICATION: 2256  RESPONSE: Txt paged.

## 2018-06-06 NOTE — ED Provider Notes (Signed)
[facility] EMERGENCY DEPARTMENT Provider Note   CSN: [id number] Arrival date & time: 06/06/18  1627     History   Chief Complaint Chief Complaint  Patient presents with  . Fall  . Altered Mental Status    HPI Lori Bright is a 84 y.o. female.  HPI  Pt lives at home by herself.  Her daughter saw her yesterday.  She had a regular day of shopping and even went to her trainer at the gym. She went to check on her today and she did not answer the door.  She did not seem to have changed her clothes from yesterday.  She was confused and saying things that did not make sense.  Pt states she had a headache and abdominal pain earlier.  Now she denies any pain.  No headache now.  She states her stomach feels funny but it doesn't hurt.  She had nausea but did not vomit. Past Medical History:  Diagnosis Date  . Anemia 11/28/2011  . Annual physical exam 05/24/2013  . Cervical cancer screening 04/03/2011  . Depression 04/25/2014  . Disorder of vitamin B12 10/09/2016  . Hyperlipidemia 11/28/2011  . Insomnia 04/11/2017  . Medicare annual wellness visit, subsequent 05/24/2013  . Parotiditis 10/18/2012  . Urinary incontinence 11/22/2013  . Wrist pain, right 04/03/2011    Patient Active Problem List   Diagnosis Date Noted  . Arthritis 10/02/2017  . Insomnia 04/11/2017  . Preventative health care 10/13/2016  . SOB (shortness of breath) 10/13/2016  . Hyperglycemia 10/13/2016  . Disorder of vitamin B12 10/09/2016  . Sclerosis, diffuse ([place]) 01/15/2016  . 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 Potassium deficiency 04/25/2014  . Depression 04/25/2014  . Medicare annual wellness visit, subsequent 05/24/2013  . Parotiditis 10/18/2012  . Anemia 11/28/2011  . Cervical cancer screening 04/03/2011  . Vitamin D deficiency 03/03/2010  . MIXED HYPERLIPIDEMIA 02/07/2010  . ESSENTIAL HYPERTENSION, BENIGN 02/07/2010  . Osteoporosis 02/07/2010  . HYPOKALEMIA, HX OF 02/07/2010  . MIGRAINES, HX OF  02/07/2010    Past Surgical History:  Procedure Laterality Date  . CATARACT EXTRACTION     b/l  . HEMORRHOID SURGERY    . TONSILLECTOMY       OB History   None      Home Medications    Prior to Admission medications   Medication Sig Start Date End Date Taking? Authorizing Provider  amLODipine (NORVASC) 5 MG tablet TAKE 1 TABLET DAILY 09/25/17   Mosie Lukes, MD  fish oil-omega-3 fatty acids 1000 MG capsule Take 1 g by mouth daily.     [provider]  hydrochlorothiazide (MICROZIDE) 12.5 MG capsule TAKE 1 CAPSULE DAILY 04/29/18   Mosie Lukes, MD  lisinopril (PRINIVIL,ZESTRIL) 20 MG tablet TAKE 1 TABLET DAILY 02/20/18   Mosie Lukes, MD    Family History Family History  Problem Relation Age of Onset  . Hip fracture Mother 56  . Heart disease Mother        w/ mitral valve replacement  . Obesity Mother   . Cancer Father        hepatic/ cigar smoker  . Cancer Brother        esophageal/ smoker  . Glaucoma Brother   . Other Daughter        early hysterectomy for menometroraghia  . Deep vein thrombosis Son   . Hip fracture Maternal Grandmother   . Other Maternal Grandmother        bladder prolapse  .  Hip fracture Paternal Grandfather   . Glaucoma Paternal Grandfather   . Deep vein thrombosis Son     Social History Social History   Tobacco Use  . Smoking status: Never Smoker  . Smokeless tobacco: Never Used  Substance Use Topics  . Alcohol use: Not on file    Comment: rare  . Drug use: No     Allergies   Patient has no known allergies.   Review of Systems Review of Systems  All other systems reviewed and are negative.    Physical Exam Updated Vital Signs BP (!) 147/69   Pulse (!) 101   Temp 98.7 F (37.1 C) (Oral)   Resp 20   SpO2 92%   Physical Exam  Constitutional: She is oriented to person, place, and time. No distress.  HENT:  Head: Normocephalic.  Right Ear: External ear normal.  Left Ear: External ear normal.    Mouth/Throat: Oropharynx is clear and moist.  Small amount of dried blood below her nose, evidence of bite on the lateral aspect of her tongue  Eyes: Conjunctivae are normal. Right eye exhibits no discharge. Left eye exhibits no discharge. No scleral icterus.  Neck: Neck supple. No tracheal deviation present.  Cardiovascular: Normal rate, regular rhythm and intact distal pulses.  Pulmonary/Chest: Effort normal and breath sounds normal. No stridor. No respiratory distress. She has no wheezes. She has no rales.  Abdominal: Soft. Bowel sounds are normal. She exhibits no distension. There is no tenderness. There is no rebound and no guarding.  Musculoskeletal: She exhibits no edema or tenderness.  Neurological: She is alert and oriented to person, place, and time. She has normal strength. No cranial nerve deficit (no facial droop, extraocular movements intact, no slurred speech) or sensory deficit. She exhibits normal muscle tone. She displays no seizure activity. Coordination normal.  No pronator drift bilateral upper extrem, able to hold both legs off bed for 5 seconds, sensation intact in all extremities, no visual field cuts, no left or right sided neglect, normal finger-nose exam bilaterally, no nystagmus noted   Skin: Skin is warm and dry. No rash noted.  Psychiatric: She has a normal mood and affect.  Nursing note and vitals reviewed.    ED Treatments / Results  Labs (all labs ordered are listed, but only abnormal results are displayed) Labs Reviewed  COMPREHENSIVE METABOLIC PANEL - Abnormal; Notable for the following components:      Result Value   Glucose, Bld 136 (*)    GFR calc non Af Amer 56 (*)    All other components within normal limits  CBC - Abnormal; Notable for the following components:   WBC 14.9 (*)    MCHC 29.9 (*)    All other components within normal limits  APTT - Abnormal; Notable for the following components:   aPTT 21 (*)    All other components within normal  limits  CBC WITH DIFFERENTIAL/PLATELET - Abnormal; Notable for the following components:   WBC 14.5 (*)    Neutro Abs 13.2 (*)    Abs Immature Granulocytes 0.10 (*)    All other components within normal limits  I-STAT TROPONIN, ED - Abnormal; Notable for the following components:   Troponin i, poc 0.34 (*)    All other components within normal limits  ETHANOL  PROTIME-INR  URINALYSIS, ROUTINE W REFLEX MICROSCOPIC  RAPID URINE DRUG SCREEN, HOSP PERFORMED  CBG MONITORING, ED    EKG EKG Interpretation  Date/Time:  Friday June 06 2018 16:54:27 EST  Ventricular Rate:  100 PR Interval:  196 QRS Duration: 100 QT Interval:  346 QTC Calculation: 446 R Axis:   -61 Text Interpretation:  Normal sinus rhythm Left anterior fascicular block Left ventricular hypertrophy Nonspecific T wave abnormality Abnormal ECG No old tracing to compare Confirmed by Dorie Rank [phone]) on 06/06/2018 5:15:26 PM   Radiology Ct Head Wo Contrast  Result Date: 06/06/2018 CLINICAL DATA:  Fall, struck posterior head. Altered mental status and weakness. History of hypertension and hyperlipidemia. EXAM: CT HEAD WITHOUT CONTRAST TECHNIQUE: Contiguous axial images were obtained from the base of the skull through the vertex without intravenous contrast. COMPARISON:  MRI of the head January 13, 2016 FINDINGS: BRAIN: RIGHT frontotemporal calvarial mass with worsening intracranial extension to middle cranial fossa measuring 2 cm in transaxial dimension, overlying RIGHT temporal scalp invasion. Extensive RIGHT frontotemporal parietal vasogenic edema. 7 mm RIGHT to LEFT midline shift. Effaced RIGHT lateral ventricle without LEFT ventricle entrapment or hydrocephalus. No intraparenchymal hemorrhage or acute large vascular territory infarcts. Mild RIGHT uncal herniation. Basal cisterns are patent. VASCULAR: Moderate calcific atherosclerosis of the carotid siphons. SKULL: No skull fracture. RIGHT frontotemporal hyperostosis. No  significant scalp soft tissue swelling. SINUSES/ORBITS: RIGHT middle ear and mastoid effusion. No air cell coalescence. Mild paranasal sinus mucosal thickening. The included ocular globes and orbital contents are non-suspicious. Status post bilateral ocular lens implants. OTHER: None. IMPRESSION: 1. New acute intracranial process. 2. Large RIGHT frontotemporal EN plaque meningioma with intracranial extension measuring to 2 cm in transaxial dimension. Extensive worsening RIGHT cerebrum vasogenic edema. 7 mm increased RIGHT to LEFT midline shift with effaced RIGHT lateral ventricle. No ventricular entrapment or hydrocephalus. Electronically Signed   By: Elon Alas M.D.   On: 06/06/2018 18:46   Dg Chest Portable 1 View  Result Date: 06/06/2018 CLINICAL DATA:  Confusion.  History of hypertension. EXAM: PORTABLE CHEST 1 VIEW COMPARISON:  Limited correlation made with left shoulder radiographs 02/14/2015. FINDINGS: 1816 hours. The heart size is at the upper limits of normal. There is a suspected moderate-size hiatal hernia. There is mild aortic atherosclerosis. There is mild atelectasis at both lung bases adjacent to the hiatal hernia but no overt pulmonary edema or confluent airspace opacity. There is no pleural effusion or pneumothorax. There is a questionable nodular density overlapping the mid right lung. This is near the confluence of an EKG snap and the inferior right scapular tip. IMPRESSION: 1. No suspected acute findings. Borderline heart size, aortic atherosclerosis and hiatal hernia. 2. Possible right-sided pulmonary nodule. For initial assessment, a repeat frontal (PA if possible) view after removing the adjacent EKG snap is recommended. Electronically Signed   By: Richardean Sale M.D.   On: 06/06/2018 18:56    Procedures .Critical Care Performed by: Dorie Rank, MD Authorized by: Dorie Rank, MD   Critical care provider statement:    Critical care time (minutes):  45   Critical care was  time spent personally by me on the following activities:  Discussions with consultants, evaluation of patient's response to treatment, examination of patient, ordering and performing treatments and interventions, ordering and review of laboratory studies, ordering and review of radiographic studies, pulse oximetry, re-evaluation of patient's condition, obtaining history from patient or surrogate and review of old charts   (including critical care time)  Medications Ordered in ED Medications  sodium chloride 0.9 % bolus 500 mL (0 mLs Intravenous Stopped 06/06/18 1825)    Followed by  0.9 %  sodium chloride infusion (has no administration in time range)  dexamethasone (DECADRON) injection 10 mg (has no administration in time range)  levETIRAcetam (KEPPRA) IVPB 1000 mg/100 mL premix (has no administration in time range)     Initial Impression / Assessment and Plan / ED Course  I have reviewed the triage vital signs and the nursing notes.  Pertinent labs & imaging results that were available during my care of the patient were reviewed by me and considered in my medical decision making (see chart for details).  Clinical Course as of Jun 06 1946  Fri Jun 06, 2018  1904 Trop elevated.  Pt denies chest pain.  Will monitor   [JK]  1944 CT findings reviewed with family and pt.  Right now she does not think she would want surgery    [JK]  1944 Discussed case with neurosurgery.  Agree with steroids and keppra.  Will see pt in the am to have discussion about the options.   [JK]    Clinical Course User Index [JK] Dorie Rank, MD    Patient presented to the emergency room for evaluation of an episode of altered mental status.  Patient's CT scan is notable for a large meningioma causing cerebral edema and midline shift.  Suspect patient may have had a seizure as a result of her meningioma.  I spoke with neurosurgery.  I will consult the medical service for admission.  Patient may ultimately decide  not to have surgery however she will clearly need to have alternative living arrangements then.  She currently lives independently in her own home  Final Clinical Impressions(s) / ED Diagnoses   Final diagnoses:  Meningioma ([place])  Cerebral edema ([place])     Dorie Rank, MD 06/06/18 1947

## 2018-06-06 NOTE — H&P (Signed)
History and Physical    Lori Bright YPP:[id number] DOB: 1923/10/13 DOA: 06/06/2018  PCP: Mosie Lukes, MD Patient coming from: Home  Chief Complaint: Fall, altered mental status  HPI: Lori Bright is a 84 y.o. female with medical history significant of anemia, depression, hyperlipidemia, meningioma presenting to the hospital for elevation of fall, altered mental status.  Patient reports feeling nauseous since yesterday night.  No episodes of vomiting at home.  States she went to sit on a chair yesterday and was shaking at that time.  She fell and hit her head on the side of the chair.  Also reports feeling a little dizzy prior to the fall.  Denies having any chest pain, shortness of breath, fevers, or chills.  States her appetite was okay until last night.  Daughter at bedside is a retired Lexicographer.  States patient is physically very active and goes to the gym every day.  Daughter saw the patient yesterday and took her to [facility].  This afternoon patient's son-in-law was doing yard work for the patient but noticed she never came outside of the house, as such, family became concerned.  Daughter went in the house to check on her and noticed patient had some dried blood on her shirt and was complaining of a headache.  She appeared confused at that time.  Daughter does not believe patient had any stroke symptoms such as facial droop or focal weakness.  Does mention that her gait has been unsteady for the past 1 week.  No prior history of seizures.  She was diagnosed with a meningioma several years ago and has previously been seen by Dr. Kathyrn Sheriff from neurosurgery.  Patient denies having any headache at present.  ED Course: Afebrile, tachycardic, blood pressure stable, satting well on room air.  White count 14.9.  UA not suggestive of infection.  Blood glucose 136.  Blood ethanol level negative.  INR 1.05.  UDS negative.  I-STAT troponin 0.34.  EKG not suggestive of ACS.  Chest x-ray showing a questionable  nodular density overlapping the mid right lung near the confluence of the EKG snap; repeat chest x-ray recommended.  CT head showing Large RIGHT frontotemporal EN plaque meningioma with intracranial extension measuring to 2 cm in transaxial dimension. Extensive worsening RIGHT cerebrum vasogenic edema. 7 mm increased RIGHT to LEFT midline shift with effaced RIGHT lateral ventricle. No ventricular entrapment or hydrocephalus. ED provider spoke to neurosurgery who recommended starting the patient on IV steroid and Keppra.  They will see the patient in the morning.  Patient received Decadron 10 mg, Keppra 1000 mg, and a 500 cc bolus of normal saline in the ED.  Review of Systems: As per HPI otherwise 10 point review of systems negative.  Past Medical History:  Diagnosis Date  . Anemia 11/28/2011  . Annual physical exam 05/24/2013  . Cervical cancer screening 04/03/2011  . Depression 04/25/2014  . Disorder of vitamin B12 10/09/2016  . Hyperlipidemia 11/28/2011  . Insomnia 04/11/2017  . Medicare annual wellness visit, subsequent 05/24/2013  . Parotiditis 10/18/2012  . Urinary incontinence 11/22/2013  . Wrist pain, right 04/03/2011    Past Surgical History:  Procedure Laterality Date  . CATARACT EXTRACTION     b/l  . HEMORRHOID SURGERY    . TONSILLECTOMY       reports that she has never smoked. She has never used smokeless tobacco. She reports that she does not use drugs. Her alcohol history is not on file.  No Known Allergies  Family History  Problem Relation Age of Onset  . Hip fracture Mother 51  . Heart disease Mother        w/ mitral valve replacement  . Obesity Mother   . Cancer Father        hepatic/ cigar smoker  . Cancer Brother        esophageal/ smoker  . Glaucoma Brother   . Other Daughter        early hysterectomy for menometroraghia  . Deep vein thrombosis Son   . Hip fracture Maternal Grandmother   . Other Maternal Grandmother        bladder prolapse  . Hip fracture  Paternal Grandfather   . Glaucoma Paternal Grandfather   . Deep vein thrombosis Son     Prior to Admission medications   Medication Sig Start Date End Date Taking? Authorizing Provider  amLODipine (NORVASC) 5 MG tablet TAKE 1 TABLET DAILY 09/25/17   Mosie Lukes, MD  fish oil-omega-3 fatty acids 1000 MG capsule Take 1 g by mouth daily.     [provider]  hydrochlorothiazide (MICROZIDE) 12.5 MG capsule TAKE 1 CAPSULE DAILY 04/29/18   Mosie Lukes, MD  lisinopril (PRINIVIL,ZESTRIL) 20 MG tablet TAKE 1 TABLET DAILY 02/20/18   Mosie Lukes, MD    Physical Exam: Vitals:   06/06/18 1915 06/06/18 2100 06/06/18 2115 06/06/18 2130  BP: (!) 153/71  124/62 (!) 127/54  Pulse: (!) 102  100 95  Resp:  (!) 21  17  Temp:      TempSrc:      SpO2: 100%  98% 96%    Physical Exam  Constitutional: She is oriented to person, place, and time. She appears well-developed and well-nourished.  Noted to be vomiting  HENT:  Head: Normocephalic.  Dry mucous membranes Bite marks and dried blood noted on the lateral aspects of the tongue  Eyes: Pupils are equal, round, and reactive to light. EOM are normal. Right eye exhibits no discharge. Left eye exhibits no discharge.  Neck: Neck supple. No tracheal deviation present.  Cardiovascular: Normal rate, regular rhythm and intact distal pulses.  Pulmonary/Chest: Effort normal and breath sounds normal. No respiratory distress. She has no wheezes. She has no rales.  Abdominal: Soft. Bowel sounds are normal. She exhibits no distension. There is no guarding.  Musculoskeletal: She exhibits no edema.  Neurological: She is alert and oriented to person, place, and time. No cranial nerve deficit.  Strength 5 out of 5 in bilateral upper and lower extremities Sensation to light touch intact throughout Tongue midline No facial droop No slurring of speech  Skin: Skin is warm and dry. She is not diaphoretic.  Psychiatric:  Very talkative and fidgety      Labs on Admission: I have personally reviewed following labs and imaging studies  CBC: Recent Labs  Lab 06/06/18 1658  WBC 14.5*  14.9*  NEUTROABS 13.2*  HGB 13.0  12.9  HCT 41.9  43.2  MCV 96.5  97.1  PLT 232  536   Basic Metabolic Panel: Recent Labs  Lab 06/06/18 1658  NA 141  K 3.7  CL 105  CO2 28  GLUCOSE 136*  BUN 21  CREATININE 0.87  CALCIUM 9.7   GFR: CrCl cannot be calculated (Unknown ideal weight.). Liver Function Tests: Recent Labs  Lab 06/06/18 1658  AST 31  ALT 23  ALKPHOS 53  BILITOT 0.6  PROT 7.1  ALBUMIN 4.0   No results for input(s): LIPASE, AMYLASE in the last 168  hours. No results for input(s): AMMONIA in the last 168 hours. Coagulation Profile: Recent Labs  Lab 06/06/18 1736  INR 1.05   Cardiac Enzymes: No results for input(s): CKTOTAL, CKMB, CKMBINDEX, TROPONINI in the last 168 hours. BNP (last 3 results) No results for input(s): PROBNP in the last 8760 hours. HbA1C: No results for input(s): HGBA1C in the last 72 hours. CBG: No results for input(s): GLUCAP in the last 168 hours. Lipid Profile: No results for input(s): CHOL, HDL, LDLCALC, TRIG, CHOLHDL, LDLDIRECT in the last 72 hours. Thyroid Function Tests: No results for input(s): TSH, T4TOTAL, FREET4, T3FREE, THYROIDAB in the last 72 hours. Anemia Panel: No results for input(s): VITAMINB12, FOLATE, FERRITIN, TIBC, IRON, RETICCTPCT in the last 72 hours. Urine analysis:    Component Value Date/Time   COLORURINE YELLOW 06/06/2018 1651   APPEARANCEUR CLOUDY (A) 06/06/2018 1651   LABSPEC >1.030 (H) 06/06/2018 1651   PHURINE 5.5 06/06/2018 1651   GLUCOSEU NEGATIVE 06/06/2018 1651   HGBUR TRACE (A) 06/06/2018 1651   BILIRUBINUR NEGATIVE 06/06/2018 1651   KETONESUR 15 (A) 06/06/2018 1651   PROTEINUR NEGATIVE 06/06/2018 1651   NITRITE NEGATIVE 06/06/2018 1651   LEUKOCYTESUR NEGATIVE 06/06/2018 1651    Radiological Exams on Admission: Ct Head Wo Contrast  Result  Date: 06/06/2018 CLINICAL DATA:  Fall, struck posterior head. Altered mental status and weakness. History of hypertension and hyperlipidemia. EXAM: CT HEAD WITHOUT CONTRAST TECHNIQUE: Contiguous axial images were obtained from the base of the skull through the vertex without intravenous contrast. COMPARISON:  MRI of the head January 13, 2016 FINDINGS: BRAIN: RIGHT frontotemporal calvarial mass with worsening intracranial extension to middle cranial fossa measuring 2 cm in transaxial dimension, overlying RIGHT temporal scalp invasion. Extensive RIGHT frontotemporal parietal vasogenic edema. 7 mm RIGHT to LEFT midline shift. Effaced RIGHT lateral ventricle without LEFT ventricle entrapment or hydrocephalus. No intraparenchymal hemorrhage or acute large vascular territory infarcts. Mild RIGHT uncal herniation. Basal cisterns are patent. VASCULAR: Moderate calcific atherosclerosis of the carotid siphons. SKULL: No skull fracture. RIGHT frontotemporal hyperostosis. No significant scalp soft tissue swelling. SINUSES/ORBITS: RIGHT middle ear and mastoid effusion. No air cell coalescence. Mild paranasal sinus mucosal thickening. The included ocular globes and orbital contents are non-suspicious. Status post bilateral ocular lens implants. OTHER: None. IMPRESSION: 1. New acute intracranial process. 2. Large RIGHT frontotemporal EN plaque meningioma with intracranial extension measuring to 2 cm in transaxial dimension. Extensive worsening RIGHT cerebrum vasogenic edema. 7 mm increased RIGHT to LEFT midline shift with effaced RIGHT lateral ventricle. No ventricular entrapment or hydrocephalus. Electronically Signed   By: Elon Alas M.D.   On: 06/06/2018 18:46   Dg Chest Portable 1 View  Result Date: 06/06/2018 CLINICAL DATA:  Confusion.  History of hypertension. EXAM: PORTABLE CHEST 1 VIEW COMPARISON:  Limited correlation made with left shoulder radiographs 02/14/2015. FINDINGS: 1816 hours. The heart size is at the  upper limits of normal. There is a suspected moderate-size hiatal hernia. There is mild aortic atherosclerosis. There is mild atelectasis at both lung bases adjacent to the hiatal hernia but no overt pulmonary edema or confluent airspace opacity. There is no pleural effusion or pneumothorax. There is a questionable nodular density overlapping the mid right lung. This is near the confluence of an EKG snap and the inferior right scapular tip. IMPRESSION: 1. No suspected acute findings. Borderline heart size, aortic atherosclerosis and hiatal hernia. 2. Possible right-sided pulmonary nodule. For initial assessment, a repeat frontal (PA if possible) view after removing the adjacent EKG snap is  recommended. Electronically Signed   By: Richardean Sale M.D.   On: 06/06/2018 18:56    EKG: Independently reviewed.  EKG showing sinus rhythm (heart rate 100), left anterior fascicular block, LVH, nonspecific T wave abnormality.  No prior EKG in the chart for comparison.  Assessment/Plan Principal Problem:   Acute encephalopathy Active Problems:   Seizure (HCC)   Meningioma (HCC)   Elevated troponin   Gait instability   HTN (hypertension)   Acute encephalopathy and suspected seizure from extension of known meningioma with worsening vasogenic edema and increase right to left midline shift -CT head showing Large RIGHT frontotemporal EN plaque meningioma with intrrlacranial extension measuring to 2 cm in transaxial dimension. Extensive worsening RIGHT cerebrum vasogenic edema. 7 mm increased RIGHT to LEFT midline shift with effaced RIGHT lateral ventricle. No ventricular entrapment or hydrocephalus. -Afebrile, slightly tachycardic, blood pressure stable, satting well on room air. Patient is currently not confused and neuro exam nonfocal.  Noted to have biting of her tongue with dried blood which raises suspicion for seizure activity at home which possibly led to a fall yesterday.  -White count 14.9, possibly  reactive.  UA not suggestive of infection. Chest x-ray showing a questionable nodular density overlapping the mid right lung near the confluence of the EKG snap; repeat chest x-ray recommended.  No confluent airspace opacity.  Will check PCT level.  Consider repeating chest x-ray in the morning. -Blood glucose 136.  Blood ethanol level negative. UDS negative.   -ED provider spoke to neurosurgery who recommended starting the patient on IV steroid and Keppra.  They will see the patient in the morning.  -Received IV Decadron 10 mg in the ED.  Will continue maintenance IV Decadron 4 mg every 6 hours. -Received loading dose IV Keppra 1000 mg in the ED. Will continue maintenance IV Keppra 500 mg twice daily. -IV fluid hydration -Tylenol PRN -IV Zofran PRN nausea -Monitor on telemetry; continuous pulse ox -Frequent neurochecks -Seizure precautions -Repeat CBC in a.m. -Keep n.p.o. after midnight  Troponinemia No history of CAD.  I-STAT troponin 0.34.  EKG not suggestive of ACS.  Patient is chest pain-free. -Continue to trend troponin  Gait instability due extension of known meningioma with worsening vasogenic edema and increase right to left midline shift -PT evaluation  Hypertension -Continue home amlodipine    DVT prophylaxis: SCDs Code Status: Full code.  Discussed with patient and daughter at bedside. Family Communication: Daughter at bedside. Disposition Plan: Anticipate discharge to SNF in 1 to 2 days. Consults called: Neurosurgery Admission status: Observation  Shela Leff MD [facility] Pager [phone]  If 7PM-7AM, please contact night-coverage [email or web address] Password [facility]  06/06/2018, 9:43 PM

## 2018-06-06 NOTE — ED Triage Notes (Signed)
Pt and family member reports that pt had a fall sometime yesterday evening, unsure of details. Pt reports hitting back of her head, no obv injuries are noted. Pt then reported having n/v at some point and is weak and altered. Denies blood thinners. Pt denies any recent urinary symptoms or diarrhea.

## 2018-06-07 DIAGNOSIS — Z83511 Family history of glaucoma: Secondary | ICD-10-CM | POA: Diagnosis not present

## 2018-06-07 DIAGNOSIS — Z9842 Cataract extraction status, left eye: Secondary | ICD-10-CM | POA: Diagnosis not present

## 2018-06-07 DIAGNOSIS — S065XAA Traumatic subdural hemorrhage with loss of consciousness status unknown, initial encounter: Secondary | ICD-10-CM | POA: Diagnosis present

## 2018-06-07 DIAGNOSIS — G9340 Encephalopathy, unspecified: Secondary | ICD-10-CM | POA: Diagnosis not present

## 2018-06-07 DIAGNOSIS — G4700 Insomnia, unspecified: Secondary | ICD-10-CM | POA: Diagnosis present

## 2018-06-07 DIAGNOSIS — G936 Cerebral edema: Secondary | ICD-10-CM | POA: Diagnosis present

## 2018-06-07 DIAGNOSIS — Z809 Family history of malignant neoplasm, unspecified: Secondary | ICD-10-CM | POA: Diagnosis not present

## 2018-06-07 DIAGNOSIS — R2681 Unsteadiness on feet: Secondary | ICD-10-CM | POA: Diagnosis not present

## 2018-06-07 DIAGNOSIS — F0390 Unspecified dementia without behavioral disturbance: Secondary | ICD-10-CM | POA: Diagnosis present

## 2018-06-07 DIAGNOSIS — Z9841 Cataract extraction status, right eye: Secondary | ICD-10-CM | POA: Diagnosis not present

## 2018-06-07 DIAGNOSIS — E782 Mixed hyperlipidemia: Secondary | ICD-10-CM | POA: Diagnosis present

## 2018-06-07 DIAGNOSIS — D329 Benign neoplasm of meninges, unspecified: Secondary | ICD-10-CM | POA: Diagnosis not present

## 2018-06-07 DIAGNOSIS — S065X9A Traumatic subdural hemorrhage with loss of consciousness of unspecified duration, initial encounter: Secondary | ICD-10-CM | POA: Diagnosis present

## 2018-06-07 DIAGNOSIS — E559 Vitamin D deficiency, unspecified: Secondary | ICD-10-CM | POA: Diagnosis present

## 2018-06-07 DIAGNOSIS — R569 Unspecified convulsions: Secondary | ICD-10-CM | POA: Diagnosis present

## 2018-06-07 DIAGNOSIS — E440 Moderate protein-calorie malnutrition: Secondary | ICD-10-CM | POA: Diagnosis present

## 2018-06-07 DIAGNOSIS — E785 Hyperlipidemia, unspecified: Secondary | ICD-10-CM | POA: Diagnosis present

## 2018-06-07 DIAGNOSIS — Z6821 Body mass index (BMI) 21.0-21.9, adult: Secondary | ICD-10-CM | POA: Diagnosis not present

## 2018-06-07 DIAGNOSIS — H905 Unspecified sensorineural hearing loss: Secondary | ICD-10-CM | POA: Diagnosis present

## 2018-06-07 DIAGNOSIS — R4182 Altered mental status, unspecified: Secondary | ICD-10-CM | POA: Diagnosis present

## 2018-06-07 DIAGNOSIS — M810 Age-related osteoporosis without current pathological fracture: Secondary | ICD-10-CM | POA: Diagnosis present

## 2018-06-07 DIAGNOSIS — Z8249 Family history of ischemic heart disease and other diseases of the circulatory system: Secondary | ICD-10-CM | POA: Diagnosis not present

## 2018-06-07 DIAGNOSIS — D320 Benign neoplasm of cerebral meninges: Secondary | ICD-10-CM | POA: Diagnosis present

## 2018-06-07 DIAGNOSIS — I10 Essential (primary) hypertension: Secondary | ICD-10-CM | POA: Diagnosis present

## 2018-06-07 LAB — CBC
HEMATOCRIT: 38.7 % (ref 36.0–46.0)
HEMOGLOBIN: 12 g/dL (ref 12.0–15.0)
MCH: 29.5 pg (ref 26.0–34.0)
MCHC: 31 g/dL (ref 30.0–36.0)
MCV: 95.1 fL (ref 80.0–100.0)
Platelets: 225 10*3/uL (ref 150–400)
RBC: 4.07 MIL/uL (ref 3.87–5.11)
RDW: 13 % (ref 11.5–15.5)
WBC: 10.3 10*3/uL (ref 4.0–10.5)
nRBC: 0 % (ref 0.0–0.2)

## 2018-06-07 LAB — TROPONIN I
Troponin I: 0.66 ng/mL (ref <0.03)
Troponin I: 0.5 ng/mL (ref <0.03)

## 2018-06-07 MED ORDER — SODIUM CHLORIDE 0.9 % IV SOLN
1000.0000 mL | INTRAVENOUS | Status: DC
  Administered 2018-06-07: 1000 mL via INTRAVENOUS

## 2018-06-07 MED ORDER — LEVETIRACETAM 500 MG PO TABS
500.0000 mg | ORAL_TABLET | Freq: Two times a day (BID) | ORAL | Status: DC
  Administered 2018-06-07 – 2018-06-10 (×7): 500 mg via ORAL
  Filled 2018-06-07 (×7): qty 1

## 2018-06-07 NOTE — Progress Notes (Signed)
[facility] PROGRESS NOTE  Lori Bright HQP:[id number] DOB: [birth year] DOA: 06/06/2018 PCP: Mosie Lukes, MD  Assessment/Plan:  Acute encephalopathy concern for seizure from extension of known meningioma with worsening vasogenic edema and increase right to left midline shift. Evaluated by neurosurgery who opine not clear if patient had a seizure that contributed to fall or if gait disturbance caused her to fall. Recommended keppra and follow up with neurosurgery in 2 weeks. No signs of infection. No metabolic derangement.   -Received IV Decadron 10 mg in the ED.   -Received loading dose IV Keppra 1000 mg in the ED. Will transition to po -evaluated by PT who recommend snf  Troponinemia No history of CAD.  trending down.  EKG not suggestive of ACS.  Patient is chest pain-free.  Gait instability due extension of known meningioma with worsening vasogenic edema and increase right to left midline shift.  -see #1 -PT evaluation results snf recommendation  Hypertension. Home meds include amlodipine and HCTZ and lisinopril. BP controlled. -holding lisinopril and HCTZ -Continue home amlodipine -resume home meds as indicated     Code Status: full Family Communication: daughter and son-in-law Disposition Plan: likely snf   Consultants:  Dr Trenton Gammon neurosurgery  Procedures:    Antibiotics:    HPI/Subjective: Ms. Lori Bright is an independent 84 year old woman brought to the ED 11/23 when her family found her with altered mental status on 06/06/2018. She has a PMH significant for meningioma, hyperlipidemia, depression, and anemia. She reports falling and hitting her head on the side of the chair. Patient's daughter did not report any stroke-like symptoms. She was concerned her mother might have had a seizure due to a cut in her mouth. Daughter denies seeing any twitching or jerking like movements.   Alert and oriented to self and place. Denies pain/discomfort  Objective: Vitals:   06/07/18 1100 06/07/18 1142  BP: 133/65   Pulse:    Resp:  18  Temp:  98.5 F (36.9 C)  SpO2:      Intake/Output Summary (Last 24 hours) at 06/07/2018 1310 Last data filed at 06/07/2018 0600 Gross per 24 hour  Intake 1770.33 ml  Output -  Net 1770.33 ml   Filed Weights   06/06/18 2247  Weight: 59.8 kg    Exam:   General:  Awake alert oriented x2, well nourished in no acute distress  Cardiovascular: rrr no mgr no LE edema  Respiratory: normal effort BS clear bilaterally no wheeze  Abdomen: non-distended, non-tender +BS no guarding or rebounding  Musculoskeletal: joints without swelling/erythema. MAE  Neuro: MAE, alert to self and place, follows commands, continues to try and get out of bed but can be redirected. Speech clear facial symmetry   Data Reviewed: Basic Metabolic Panel: Recent Labs  Lab 06/06/18 1658  NA 141  K 3.7  CL 105  CO2 28  GLUCOSE 136*  BUN 21  CREATININE 0.87  CALCIUM 9.7   Liver Function Tests: Recent Labs  Lab 06/06/18 1658  AST 31  ALT 23  ALKPHOS 53  BILITOT 0.6  PROT 7.1  ALBUMIN 4.0   No results for input(s): LIPASE, AMYLASE in the last 168 hours. No results for input(s): AMMONIA in the last 168 hours. CBC: Recent Labs  Lab 06/06/18 1658 06/07/18 0407  WBC 14.5*  14.9* 10.3  NEUTROABS 13.2*  --   HGB 13.0  12.9 12.0  HCT 41.9  43.2 38.7  MCV 96.5  97.1 95.1  PLT 232  244 225  Cardiac Enzymes: Recent Labs  Lab 06/06/18 2137 06/07/18 0407 06/07/18 1002  TROPONINI 0.73* 0.66* 0.50*   BNP (last 3 results) No results for input(s): BNP in the last 8760 hours.  ProBNP (last 3 results) No results for input(s): PROBNP in the last 8760 hours.  CBG: No results for input(s): GLUCAP in the last 168 hours.  No results found for this or any previous visit (from the past 240 hour(s)).   Studies: Ct Head Wo Contrast  Result Date: 06/06/2018 CLINICAL DATA:  Fall, struck posterior head. Altered mental status  and weakness. History of hypertension and hyperlipidemia. EXAM: CT HEAD WITHOUT CONTRAST TECHNIQUE: Contiguous axial images were obtained from the base of the skull through the vertex without intravenous contrast. COMPARISON:  MRI of the head January 13, 2016 FINDINGS: BRAIN: RIGHT frontotemporal calvarial mass with worsening intracranial extension to middle cranial fossa measuring 2 cm in transaxial dimension, overlying RIGHT temporal scalp invasion. Extensive RIGHT frontotemporal parietal vasogenic edema. 7 mm RIGHT to LEFT midline shift. Effaced RIGHT lateral ventricle without LEFT ventricle entrapment or hydrocephalus. No intraparenchymal hemorrhage or acute large vascular territory infarcts. Mild RIGHT uncal herniation. Basal cisterns are patent. VASCULAR: Moderate calcific atherosclerosis of the carotid siphons. SKULL: No skull fracture. RIGHT frontotemporal hyperostosis. No significant scalp soft tissue swelling. SINUSES/ORBITS: RIGHT middle ear and mastoid effusion. No air cell coalescence. Mild paranasal sinus mucosal thickening. The included ocular globes and orbital contents are non-suspicious. Status post bilateral ocular lens implants. OTHER: None. IMPRESSION: 1. New acute intracranial process. 2. Large RIGHT frontotemporal EN plaque meningioma with intracranial extension measuring to 2 cm in transaxial dimension. Extensive worsening RIGHT cerebrum vasogenic edema. 7 mm increased RIGHT to LEFT midline shift with effaced RIGHT lateral ventricle. No ventricular entrapment or hydrocephalus. Electronically Signed   By: Elon Alas M.D.   On: 06/06/2018 18:46   Dg Chest Portable 1 View  Result Date: 06/06/2018 CLINICAL DATA:  Confusion.  History of hypertension. EXAM: PORTABLE CHEST 1 VIEW COMPARISON:  Limited correlation made with left shoulder radiographs 02/14/2015. FINDINGS: 1816 hours. The heart size is at the upper limits of normal. There is a suspected moderate-size hiatal hernia. There is  mild aortic atherosclerosis. There is mild atelectasis at both lung bases adjacent to the hiatal hernia but no overt pulmonary edema or confluent airspace opacity. There is no pleural effusion or pneumothorax. There is a questionable nodular density overlapping the mid right lung. This is near the confluence of an EKG snap and the inferior right scapular tip. IMPRESSION: 1. No suspected acute findings. Borderline heart size, aortic atherosclerosis and hiatal hernia. 2. Possible right-sided pulmonary nodule. For initial assessment, a repeat frontal (PA if possible) view after removing the adjacent EKG snap is recommended. Electronically Signed   By: Richardean Sale M.D.   On: 06/06/2018 18:56    Scheduled Meds: . amLODipine  5 mg Oral Daily  . levETIRAcetam  500 mg Oral BID  . omega-3 acid ethyl esters  1 g Oral Daily   Continuous Infusions: . sodium chloride      Principal Problem:   Acute encephalopathy Active Problems:   Meningioma (HCC)   Gait instability   Elevated troponin   HTN (hypertension)    Time spent: NP    Radene Gunning NP [facility]  If 7PM-7AM, please contact night-coverage at [email or web address], password [facility] 06/07/2018, 1:10 PM  LOS: 0 days

## 2018-06-07 NOTE — Evaluation (Signed)
Physical Therapy Evaluation Patient Details Name: Lori Bright MRN: [id number] DOB: 01-16-1924 Today's Date: 06/07/2018   History of Present Illness  Pt is a 84 y/o female with PMH of meningioma and HLD was admitted from home after a fall and with AMS. CT head showing Large RIGHT frontotemporal EN plaque meningioma with extension measuring to 2 cm in transaxial dimension. Extensive worsening RIGHT cerebrum vasogenic edema. 7 mm increased RIGHT to LEFT midline shift with effaced RIGHT lateral ventricle.     Clinical Impression  Pt presented supine in bed with HOB elevated, awake and willing to participate in therapy session. Prior to admission, pt reported that she was independent with all functional mobility and ADLs. Pt lives alone in a two level home but is able to live on the first level. Pt's daughter and son-in-law present throughout session. Pt is currently very restless and impulsive with poor safety awareness and poor insight into deficits. She currently requires min A x2 for transfers and mod A x2 to ambulate with HHA. Pt's HR increasing with activity from low 90's to 124 bpm at highest. Pt would continue to benefit from skilled physical therapy services at this time while admitted and after d/c to address the below listed limitations in order to improve overall safety and independence with functional mobility.     Follow Up Recommendations SNF;Supervision/Assistance - 24 hour    Equipment Recommendations  None recommended by PT    Recommendations for Other Services       Precautions / Restrictions Precautions Precautions: Fall Precaution Comments: VERY IMPULSIVE Restrictions Weight Bearing Restrictions: No      Mobility  Bed Mobility Overal bed mobility: Modified Independent             General bed mobility comments: impulsive  Transfers Overall transfer level: Needs assistance Equipment used: 2 person hand held assist Transfers: Sit to/from Stand Sit to Stand: +2  physical assistance;+2 safety/equipment;Min assist         General transfer comment: pt very impulsive, attempting several times to stand from EOB even while IV line and monitor lines were tangled up around her and pulling; pt had to be physically stopped and blocked from standing  Ambulation/Gait Ambulation/Gait assistance: Mod assist;+2 physical assistance Gait Distance (Feet): 40 Feet Assistive device: 2 person hand held assist Gait Pattern/deviations: Step-through pattern;Decreased step length - right;Decreased step length - left;Decreased stride length Gait velocity: decreased   General Gait Details: pt very unsteady and unsafe with ambulation, requiring mod A x2; limited secondary to fatigue and increase in HR from 90's to 124bpm at [employer] Mobility    Modified Rankin (Stroke Patients Only)       Balance Overall balance assessment: Needs assistance Sitting-balance support: Feet supported Sitting balance-Leahy Scale: Fair     Standing balance support: During functional activity;Bilateral upper extremity supported Standing balance-Leahy Scale: Poor                               Pertinent Vitals/Pain Pain Assessment: No/denies pain    Home Living Family/patient expects to be discharged to:: Private residence Living Arrangements: Alone Available Help at Discharge: Family;Available PRN/intermittently Type of Home: House Home Access: Stairs to enter   Entrance Stairs-Number of Steps: 1 Home Layout: Able to live on main level with bedroom/bathroom Home Equipment: Walker - 4 wheels;Cane - single point;Shower seat;Wheelchair - power  Prior Function Level of Independence: Independent               Hand Dominance        Extremity/Trunk Assessment   Upper Extremity Assessment Upper Extremity Assessment: Overall WFL for tasks assessed    Lower Extremity Assessment Lower Extremity Assessment: Generalized  weakness       Communication   Communication: No difficulties  Cognition Arousal/Alertness: Awake/alert Behavior During Therapy: Impulsive;Restless Overall Cognitive Status: Impaired/Different from baseline Area of Impairment: Attention;Memory;Following commands;Safety/judgement;Problem solving;Awareness                   Current Attention Level: Focused Memory: Decreased recall of precautions;Decreased short-term memory Following Commands: Follows one step commands inconsistently Safety/Judgement: Decreased awareness of deficits;Decreased awareness of safety Awareness: Intellectual Problem Solving: Difficulty sequencing;Requires verbal cues;Requires tactile cues        General Comments      Exercises     Assessment/Plan    PT Assessment Patient needs continued PT services  PT Problem List Decreased mobility;Decreased coordination;Decreased balance;Decreased cognition;Decreased knowledge of use of DME;Decreased safety awareness;Decreased knowledge of precautions       PT Treatment Interventions DME instruction;Gait training;Functional mobility training;Stair training;Therapeutic activities;Therapeutic exercise;Balance training;Neuromuscular re-education;Cognitive remediation;Patient/family education    PT Goals (Current goals can be found in the Care Plan section)  Acute Rehab PT Goals Patient Stated Goal: get back to independence PT Goal Formulation: With patient/family Time For Goal Achievement: 06/21/18 Potential to Achieve Goals: Good    Frequency Min 3X/week   Barriers to discharge        Co-evaluation               AM-PAC PT "6 Clicks" Mobility  Outcome Measure Help needed turning from your back to your side while in a flat bed without using bedrails?: None Help needed moving from lying on your back to sitting on the side of a flat bed without using bedrails?: None Help needed moving to and from a bed to a chair (including a wheelchair)?: A  Lot Help needed standing up from a chair using your arms (e.g., wheelchair or bedside chair)?: A Lot Help needed to walk in hospital room?: A Lot Help needed climbing 3-5 steps with a railing? : A Lot 6 Click Score: 16    End of Session Equipment Utilized During Treatment: Gait belt Activity Tolerance: Patient tolerated treatment well Patient left: in bed;with call bell/phone within reach;with bed alarm set;with nursing/sitter in room;with family/visitor present;Other (comment)(seizure precautions) Nurse Communication: Mobility status PT Visit Diagnosis: Other abnormalities of gait and mobility (R26.89);Difficulty in walking, not elsewhere classified (R26.2)    Time: 2482-5003 PT Time Calculation (min) (ACUTE ONLY): 25 min   Charges:   PT Evaluation $PT Eval Moderate Complexity: 1 Mod PT Treatments $Therapeutic Activity: 8-22 mins        Sherie Don, PT, DPT  Acute Rehabilitation Services Pager [phone] Office [place] 06/07/2018, 12:18 PM

## 2018-06-07 NOTE — Consult Note (Addendum)
Reason for Consult:Meningioma Referring Physician: Dr. Abelina Bachelor is an 84 y.o. female.  HPI: Ms. Neria is an independent 84 year old woman brought to the ED when her family found her with altered mental status on 06/06/2018. She has a PMH significant for meningioma, hyperlipidemia, depression, and anemia. She reports falling and hitting her head on the side of the chair. Patient's daughter did not report any stroke-like symptoms. She was concerned her mother might have had a seizure due to a cut in her mouth. Patient's daughter also reported a gait disturbance x 1 week. The patient does not have any prior history of seizures. She was dignosed with a right temporal meningioma several years ago. CT revealed a slight increase in the meningioma's size and an increase in vasogenic edema.  Past Medical History:  Diagnosis Date  . Anemia 11/28/2011  . Annual physical exam 05/24/2013  . Cervical cancer screening 04/03/2011  . Depression 04/25/2014  . Disorder of vitamin B12 10/09/2016  . Hyperlipidemia 11/28/2011  . Insomnia 04/11/2017  . Medicare annual wellness visit, subsequent 05/24/2013  . Parotiditis 10/18/2012  . Urinary incontinence 11/22/2013  . Wrist pain, right 04/03/2011    Past Surgical History:  Procedure Laterality Date  . CATARACT EXTRACTION     b/l  . HEMORRHOID SURGERY    . TONSILLECTOMY      Family History  Problem Relation Age of Onset  . Hip fracture Mother 100  . Heart disease Mother        w/ mitral valve replacement  . Obesity Mother   . Cancer Father        hepatic/ cigar smoker  . Cancer Brother        esophageal/ smoker  . Glaucoma Brother   . Other Daughter        early hysterectomy for menometroraghia  . Deep vein thrombosis Son   . Hip fracture Maternal Grandmother   . Other Maternal Grandmother        bladder prolapse  . Hip fracture Paternal Grandfather   . Glaucoma Paternal Grandfather   . Deep vein thrombosis Son     Social History:  reports  that she has never smoked. She has never used smokeless tobacco. She reports that she does not use drugs. Her alcohol history is not on file.  Allergies: No Known Allergies  Medications: I have reviewed the patient's current medications.  Results for orders placed or performed during the hospital encounter of 06/06/18 (from the past 48 hour(s))  Urinalysis, Routine w reflex microscopic     Status: Abnormal   Collection Time: 06/06/18  4:51 PM  Result Value Ref Range   Color, Urine YELLOW YELLOW   APPearance CLOUDY (A) CLEAR   Specific Gravity, Urine >1.030 (H) 1.005 - 1.030   pH 5.5 5.0 - 8.0   Glucose, UA NEGATIVE NEGATIVE mg/dL   Hgb urine dipstick TRACE (A) NEGATIVE   Bilirubin Urine NEGATIVE NEGATIVE   Ketones, ur 15 (A) NEGATIVE mg/dL   Protein, ur NEGATIVE NEGATIVE mg/dL   Nitrite NEGATIVE NEGATIVE   Leukocytes, UA NEGATIVE NEGATIVE    Comment: Performed at [facility] [address] [place], [place] [address]  Urinalysis, Microscopic (reflex)     Status: Abnormal   Collection Time: 06/06/18  4:51 PM  Result Value Ref Range   RBC / HPF 0-5 0 - 5 RBC/hpf   WBC, UA 0-5 0 - 5 WBC/hpf   Bacteria, UA FEW (A) NONE SEEN  Squamous Epithelial / LPF 6-10 0 - 5   Hyaline Casts, UA PRESENT    Urine-Other MICROSCOPIC EXAM PERFORMED ON UNCONCENTRATED URINE     Comment: LESS THAN 10 mL OF URINE SUBMITTED Performed at [facility], [address], [place], [place] [address]   Comprehensive metabolic panel     Status: Abnormal   Collection Time: 06/06/18  4:58 PM  Result Value Ref Range   Sodium 141 135 - 145 mmol/L   Potassium 3.7 3.5 - 5.1 mmol/L    Comment: HEMOLYSIS AT THIS LEVEL MAY AFFECT RESULT   Chloride 105 98 - 111 mmol/L   CO2 28 22 - 32 mmol/L   Glucose, Bld 136 (H) 70 - 99 mg/dL   BUN 21 8 - 23 mg/dL   Creatinine, Ser 0.87 0.44 - 1.00 mg/dL   Calcium 9.7 8.9 - 10.3 mg/dL   Total Protein 7.1 6.5 - 8.1 g/dL   Albumin 4.0 3.5 - 5.0 g/dL   AST 31 15 - 41  U/L   ALT 23 0 - 44 U/L   Alkaline Phosphatase 53 38 - 126 U/L   Total Bilirubin 0.6 0.3 - 1.2 mg/dL   GFR calc non Af Amer 56 (L) >60 mL/min   GFR calc Af Amer >60 >60 mL/min    Comment: (NOTE) The eGFR has been calculated using the CKD EPI equation. This calculation has not been validated in all clinical situations. eGFR's persistently <60 mL/min signify possible Chronic Kidney Disease.    Anion gap 8 5 - 15    Comment: Performed at [facility] [address] [place], [place] [address]  CBC     Status: Abnormal   Collection Time: 06/06/18  4:58 PM  Result Value Ref Range   WBC 14.9 (H) 4.0 - 10.5 K/uL   RBC 4.45 3.87 - 5.11 MIL/uL   Hemoglobin 12.9 12.0 - 15.0 g/dL   HCT 43.2 36.0 - 46.0 %   MCV 97.1 80.0 - 100.0 fL   MCH 29.0 26.0 - 34.0 pg   MCHC 29.9 (L) 30.0 - 36.0 g/dL   RDW 12.9 11.5 - 15.5 %   Platelets 244 150 - 400 K/uL   nRBC 0.0 0.0 - 0.2 %    Comment: Performed at [facility], [place] [address] [place], [place] [address]  CBC with Differential/Platelet     Status: Abnormal   Collection Time: 06/06/18  4:58 PM  Result Value Ref Range   WBC 14.5 (H) 4.0 - 10.5 K/uL   RBC 4.34 3.87 - 5.11 MIL/uL   Hemoglobin 13.0 12.0 - 15.0 g/dL   HCT 41.9 36.0 - 46.0 %   MCV 96.5 80.0 - 100.0 fL   MCH 30.0 26.0 - 34.0 pg   MCHC 31.0 30.0 - 36.0 g/dL   RDW 12.9 11.5 - 15.5 %   Platelets 232 150 - 400 K/uL   nRBC 0.0 0.0 - 0.2 %   Neutrophils Relative % 90 %   Neutro Abs 13.2 (H) 1.7 - 7.7 K/uL   Lymphocytes Relative 5 %   Lymphs Abs 0.7 0.7 - 4.0 K/uL   Monocytes Relative 3 %   Monocytes Absolute 0.4 0.1 - 1.0 K/uL   Eosinophils Relative 1 %   Eosinophils Absolute 0.1 0.0 - 0.5 K/uL   Basophils Relative 0 %   Basophils Absolute 0.0 0.0 - 0.1 K/uL   Immature Granulocytes 1 %   Abs Immature Granulocytes 0.10 (H) 0.00 - 0.07  K/uL    Comment: Performed at [facility], [place] [address], [place], [place] [address]  Ethanol     Status: None   Collection  Time: 06/06/18  5:36 PM  Result Value Ref Range   Alcohol, Ethyl (B) <10 <10 mg/dL    Comment: (NOTE) Lowest detectable limit for serum alcohol is 10 mg/dL. For medical purposes only. Performed at [facility], [place] [address] [place], [place] [address]   Protime-INR     Status: None   Collection Time: 06/06/18  5:36 PM  Result Value Ref Range   Prothrombin Time 13.6 11.4 - 15.2 seconds   INR 1.05     Comment: Performed at [facility] [address], [place], [place] [address]  APTT     Status: Abnormal   Collection Time: 06/06/18  5:36 PM  Result Value Ref Range   aPTT 21 (L) 24 - 36 seconds    Comment: Performed at [facility] [address] [place], [place] [address]  Urine rapid drug screen (hosp performed)not at [facility]     Status: None   Collection Time: 06/06/18  5:36 PM  Result Value Ref Range   Opiates NONE DETECTED NONE DETECTED   Cocaine NONE DETECTED NONE DETECTED   Benzodiazepines NONE DETECTED NONE DETECTED   Amphetamines NONE DETECTED NONE DETECTED   Tetrahydrocannabinol NONE DETECTED NONE DETECTED   Barbiturates NONE DETECTED NONE DETECTED    Comment: (NOTE) DRUG SCREEN FOR MEDICAL PURPOSES ONLY.  IF CONFIRMATION IS NEEDED FOR ANY PURPOSE, NOTIFY LAB WITHIN 5 DAYS. LOWEST DETECTABLE LIMITS FOR URINE DRUG SCREEN Drug Class                     Cutoff (ng/mL) Amphetamine and metabolites    1000 Barbiturate and metabolites    200 Benzodiazepine                 010 Tricyclics and metabolites     300 Opiates and metabolites        300 Cocaine and metabolites        300 THC                            50 Performed at [facility], [place] [address] [place], [place] [address]   I-stat troponin, ED (not at [facility], [facility])     Status: Abnormal   Collection Time: 06/06/18  5:47 PM  Result Value Ref Range   Troponin i, poc 0.34 (HH) 0.00 - 0.08 ng/mL   Comment NOTIFIED PHYSICIAN    Comment 3            Comment: Due to the release kinetics of  cTnI, a negative result within the first hours of the onset of symptoms does not rule out myocardial infarction with certainty. If myocardial infarction is still suspected, repeat the test at appropriate intervals.   Troponin I - Now Then Q6H     Status: Abnormal   Collection Time: 06/06/18  9:37 PM  Result Value Ref Range   Troponin I 0.73 (HH) <0.03 ng/mL    Comment: CRITICAL RESULT CALLED TO, READ BACK BY AND VERIFIED WITH: TARPLEY K,RN 06/06/18 2238 WAYK Performed at [facility] [address], [place], [place] [address]   Procalcitonin - Baseline     Status: None   Collection Time: 06/06/18  9:37 PM  Result Value Ref Range  Procalcitonin 0.13 ng/mL    Comment:        Interpretation: PCT (Procalcitonin) <= 0.5 ng/mL: Systemic infection (sepsis) is not likely. Local bacterial infection is possible. (NOTE)       Sepsis PCT Algorithm           Lower Respiratory Tract                                      Infection PCT Algorithm    ----------------------------     ----------------------------         PCT < 0.25 ng/mL                PCT < 0.10 ng/mL         Strongly encourage             Strongly discourage   discontinuation of antibiotics    initiation of antibiotics    ----------------------------     -----------------------------       PCT 0.25 - 0.50 ng/mL            PCT 0.10 - 0.25 ng/mL               OR       >80% decrease in PCT            Discourage initiation of                                            antibiotics      Encourage discontinuation           of antibiotics    ----------------------------     -----------------------------         PCT >= 0.50 ng/mL              PCT 0.26 - 0.50 ng/mL               AND        <80% decrease in PCT             Encourage initiation of                                             antibiotics       Encourage continuation           of antibiotics    ----------------------------     -----------------------------         PCT >= 0.50 ng/mL                  PCT > 0.50 ng/mL               AND         increase in PCT                  Strongly encourage                                      initiation of antibiotics    Strongly encourage escalation           of antibiotics                                     -----------------------------  PCT <= 0.25 ng/mL                                                 OR                                        > 80% decrease in PCT                                     Discontinue / Do not initiate                                             antibiotics Performed at [facility], [place] [address], [place] [address]   CBC     Status: None   Collection Time: 06/07/18  4:07 AM  Result Value Ref Range   WBC 10.3 4.0 - 10.5 K/uL   RBC 4.07 3.87 - 5.11 MIL/uL   Hemoglobin 12.0 12.0 - 15.0 g/dL   HCT 38.7 36.0 - 46.0 %   MCV 95.1 80.0 - 100.0 fL   MCH 29.5 26.0 - 34.0 pg   MCHC 31.0 30.0 - 36.0 g/dL   RDW 13.0 11.5 - 15.5 %   Platelets 225 150 - 400 K/uL   nRBC 0.0 0.0 - 0.2 %    Comment: Performed at [facility], [place] [address], [place], [place] [address]  Troponin I - Now Then Q6H     Status: Abnormal   Collection Time: 06/07/18  4:07 AM  Result Value Ref Range   Troponin I 0.66 (HH) <0.03 ng/mL    Comment: CRITICAL VALUE NOTED.  VALUE IS CONSISTENT WITH PREVIOUSLY REPORTED AND CALLED VALUE. Performed at [facility], [place] [address] [place], [place] [address]     Ct Head Wo Contrast  Result Date: 06/06/2018 CLINICAL DATA:  Fall, struck posterior head. Altered mental status and weakness. History of hypertension and hyperlipidemia. EXAM: CT HEAD WITHOUT CONTRAST TECHNIQUE: Contiguous axial images were obtained from the base of the skull through the vertex without intravenous contrast. COMPARISON:  MRI of the head January 13, 2016 FINDINGS: BRAIN: RIGHT frontotemporal calvarial mass with worsening  intracranial extension to middle cranial fossa measuring 2 cm in transaxial dimension, overlying RIGHT temporal scalp invasion. Extensive RIGHT frontotemporal parietal vasogenic edema. 7 mm RIGHT to LEFT midline shift. Effaced RIGHT lateral ventricle without LEFT ventricle entrapment or hydrocephalus. No intraparenchymal hemorrhage or acute large vascular territory infarcts. Mild RIGHT uncal herniation. Basal cisterns are patent. VASCULAR: Moderate calcific atherosclerosis of the carotid siphons. SKULL: No skull fracture. RIGHT frontotemporal hyperostosis. No significant scalp soft tissue swelling. SINUSES/ORBITS: RIGHT middle ear and mastoid effusion. No air cell coalescence. Mild paranasal sinus mucosal thickening. The included ocular globes and orbital contents are non-suspicious. Status post bilateral ocular lens implants. OTHER: None. IMPRESSION: 1. New acute intracranial process. 2. Large RIGHT frontotemporal EN plaque meningioma with intracranial extension measuring to 2 cm in transaxial dimension. Extensive worsening RIGHT cerebrum vasogenic edema. 7 mm increased RIGHT to LEFT midline shift with effaced RIGHT  lateral ventricle. No ventricular entrapment or hydrocephalus. Electronically Signed   By: Elon Alas M.D.   On: 06/06/2018 18:46   Dg Chest Portable 1 View  Result Date: 06/06/2018 CLINICAL DATA:  Confusion.  History of hypertension. EXAM: PORTABLE CHEST 1 VIEW COMPARISON:  Limited correlation made with left shoulder radiographs 02/14/2015. FINDINGS: 1816 hours. The heart size is at the upper limits of normal. There is a suspected moderate-size hiatal hernia. There is mild aortic atherosclerosis. There is mild atelectasis at both lung bases adjacent to the hiatal hernia but no overt pulmonary edema or confluent airspace opacity. There is no pleural effusion or pneumothorax. There is a questionable nodular density overlapping the mid right lung. This is near the confluence of an EKG snap and  the inferior right scapular tip. IMPRESSION: 1. No suspected acute findings. Borderline heart size, aortic atherosclerosis and hiatal hernia. 2. Possible right-sided pulmonary nodule. For initial assessment, a repeat frontal (PA if possible) view after removing the adjacent EKG snap is recommended. Electronically Signed   By: Richardean Sale M.D.   On: 06/06/2018 18:56    Review of Systems  Constitutional: Negative for chills, diaphoresis, fever, malaise/fatigue and weight loss.  HENT: Positive for hearing loss. Negative for congestion, ear discharge, ear pain, nosebleeds, sinus pain, sore throat and tinnitus.   Eyes: Negative.   Respiratory: Negative for stridor.   Cardiovascular: Negative.   Gastrointestinal: Negative for abdominal pain, diarrhea, nausea and vomiting.  Genitourinary: Negative for dysuria, flank pain, frequency, hematuria and urgency.  Musculoskeletal: Positive for falls. Negative for back pain, joint pain, myalgias and neck pain.  Neurological: Negative for dizziness, tingling, tremors, sensory change, speech change, focal weakness and headaches.       Gait disturbance  Endo/Heme/Allergies: Negative for environmental allergies and polydipsia. Does not bruise/bleed easily.   Blood pressure (!) 110/48, pulse 87, temperature 98.2 F (36.8 C), temperature source Oral, resp. rate 18, height _0  (1.676 m), weight 59.8 kg, SpO2 97 %. Physical Exam  Constitutional: She is oriented to person, place, and time. She appears well-developed and well-nourished.  HENT:  Head:    Eyes: Pupils are equal, round, and reactive to light. Conjunctivae are normal.  Neck: Normal range of motion. Neck supple.  Respiratory: Effort normal.  GI: Soft. Bowel sounds are normal.  Musculoskeletal: Normal range of motion.  Neurological: She is alert and oriented to person, place, and time.  Skin: Skin is warm and dry.  Psychiatric: She has a normal mood and affect.    Assessment/Plan: Ms. Lecuyer  has a several year history of right-sided meningioma. She was brought to the ED due to AMS. It is questionable as to whether Ms. Herrero had a seizure that contributed to her fall or if a gait disturbance caused her to fall. Her nausea/vomiting is unrelated to her meningioma and likely related to a gastrointestinal disturbance, She should continue the Weinert prophylacticly for 3-4 weeks. She should refrain from driving once she is 2 weeks seizure free. Family is agreeable to seeing how she progresses through Thanksgiving before making any decisions regarding surgery versus conservative management.  Patricia Nettle 06/07/2018, 10:28 AM   Patient seen and examined.  Agree with the assessment and plan.  Recommend follow-up in my office in a couple weeks.  Okay for discharge home.

## 2018-06-08 MED ORDER — HALOPERIDOL LACTATE 5 MG/ML IJ SOLN
5.0000 mg | Freq: Once | INTRAMUSCULAR | Status: AC
  Administered 2018-06-08: 5 mg via INTRAVENOUS
  Filled 2018-06-08: qty 1

## 2018-06-08 NOTE — Clinical Social Work Note (Signed)
Clinical Social Work Assessment  Patient Details  Name: Lori Bright MRN: [id number] Date of Birth: 10-03-1923  Date of referral:  06/08/18               Reason for consult:  Discharge Planning                Permission sought to share information with:  Case Manager, Facility Sport and exercise psychologist Permission granted to share information::  Yes, Verbal Permission Granted  Name::     Film/video editor::  [place]  Relationship::  Daughter   Contact Information:     Housing/Transportation Living arrangements for the past 2 months:  Single Family Home Source of Information:  Patient, Adult Children Patient Interpreter Needed:  None Criminal Activity/Legal Involvement Pertinent to Current Situation/Hospitalization:  No - Comment as needed Significant Relationships:  Adult Children Lives with:  Self Do you feel safe going back to the place where you live?  Yes Need for family participation in patient care:  Yes (Comment)  Care giving concerns:  CSW met with patient and daughter. Patient was living alone and daughter reported that the patient originally did not want to go to a SNF placement but is starting to come around to the idea.    Social Worker assessment / plan:  CSW met with patient and daughter at bedside. CSW spoke with the daughter, the daughter is wanting her mother to go to a SNF facility. Family would like the patient to go to [facility]. Family also suggested [facility]. CSW completed FL2 note and sent note to [facility]. CSW will need to send note to [facility].   Employment status:  Retired Nurse, adult PT Recommendations:  [place] / Referral to community resources:  [place]  Patient/Family's Response to care:  Family is supportive of the patient going to a [facility].   Patient/Family's Understanding of and Emotional Response to Diagnosis, Current Treatment, and Prognosis:  Family  is understanding about the patient needing rehab after being discharged from the hospital.   Emotional Assessment Appearance:  Appears stated age Attitude/Demeanor/Rapport:  Unable to Assess Affect (typically observed):  Unable to Assess Orientation:  Oriented to Self, Oriented to Place, Oriented to  Time Alcohol / Substance use:  Not Applicable Psych involvement (Current and /or in the community):  No (Comment)  Discharge Needs  Concerns to be addressed:  Discharge Planning Concerns Readmission within the last 30 days:  No Current discharge risk:  Dependent with Mobility, Lives alone Barriers to Discharge:  Continued Medical Work up, [place], [place] 06/08/2018, 11:44 AM

## 2018-06-08 NOTE — NC FL2 (Signed)
[place] LEVEL OF CARE SCREENING TOOL     IDENTIFICATION  Patient Name: Lori Bright Birthdate: 03-27-1924 Sex: female Admission Date (Current Location): 06/06/2018  [facility] and [place] Number:  Herbalist and Address:  [facility] [place] [place] [address], [place], [place] [address]      Provider Number: [id number]  Attending Physician Name and Address:  Nita Sells, MD  Relative Name and Phone Number:  Langley Gauss [phone]) Daughter, Camila Li [phone]) Son    Current Level of Care: Hospital Recommended Level of Care: [place] Prior Approval Number:    Date Approved/Denied:   PASRR Number: Under Manual Review   Discharge Plan: SNF    Current Diagnoses: Patient Active Problem List   Diagnosis Date Noted  . Subdural hematoma ([place]) 06/07/2018  . Acute encephalopathy 06/06/2018  . Seizure ([place]) 06/06/2018  . Meningioma ([place]) 06/06/2018  . Elevated troponin 06/06/2018  . Gait instability 06/06/2018  . HTN (hypertension) 06/06/2018  . Arthritis 10/02/2017  . Insomnia 04/11/2017  . Preventative health care 10/13/2016  . SOB (shortness of breath) 10/13/2016  . Hyperglycemia 10/13/2016  . Disorder of vitamin B12 10/09/2016  . Sclerosis, diffuse ([place]) 01/15/2016  . 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 Potassium deficiency 04/25/2014  . Depression 04/25/2014  . Medicare annual wellness visit, subsequent 05/24/2013  . Parotiditis 10/18/2012  . Anemia 11/28/2011  . Cervical cancer screening 04/03/2011  . Vitamin D deficiency 03/03/2010  . MIXED HYPERLIPIDEMIA 02/07/2010  . ESSENTIAL HYPERTENSION, BENIGN 02/07/2010  . Osteoporosis 02/07/2010  . HYPOKALEMIA, HX OF 02/07/2010  . MIGRAINES, HX OF 02/07/2010    Orientation RESPIRATION BLADDER Height & Weight     Self, Place, Time  Normal Continent Weight: 131 lb 13.4 oz (59.8 kg) Height:  5\' 6"  (167.6 cm)  BEHAVIORAL SYMPTOMS/MOOD NEUROLOGICAL BOWEL NUTRITION  STATUS    Convulsions/Seizures   Diet([facility] )  AMBULATORY STATUS COMMUNICATION OF NEEDS Skin   Independent Verbally Normal                       Personal Care Assistance Level of Assistance  Bathing, Feeding, Dressing Bathing Assistance: Limited assistance Feeding assistance: Limited assistance Dressing Assistance: Limited assistance     Functional Limitations Info  Hearing, Speech, Sight Sight Info: Adequate Hearing Info: Adequate Speech Info: Adequate    SPECIAL CARE FACTORS FREQUENCY  PT (By licensed PT), OT (By licensed OT), Speech therapy     PT Frequency: 5x/wk OT Frequency: 5x/wk     Speech Therapy Frequency: 5x/wk      Contractures Contractures Info: Not present    Additional Factors Info  Allergies, Code Status Code Status Info: Full Code  Allergies Info: No known allergies           Current Medications (06/08/2018):  This is the current hospital active medication list Current Facility-Administered Medications  Medication Dose Route Frequency Provider Last Rate Last Dose  . acetaminophen (TYLENOL) tablet 650 mg  650 mg Oral Q6H PRN Shela Leff, MD       Or  . acetaminophen (TYLENOL) suppository 650 mg  650 mg Rectal Q6H PRN Shela Leff, MD      . amLODipine (NORVASC) tablet 5 mg  5 mg Oral Daily Shela Leff, MD   5 mg at 06/08/18 1003  . levETIRAcetam (KEPPRA) tablet 500 mg  500 mg Oral BID Radene Gunning, NP   500 mg at 06/08/18 1003  . omega-3 acid ethyl esters (LOVAZA) capsule  1 g  1 g Oral Daily Shela Leff, MD   1 g at 06/08/18 1003  . ondansetron (ZOFRAN) injection 4 mg  4 mg Intravenous Q6H PRN Shela Leff, MD         Discharge Medications: Please see discharge summary for a list of discharge medications.  Relevant Imaging Results:  Relevant Lab Results:   Additional Information SSN: [id number]  [place], LCSWA

## 2018-06-08 NOTE — Progress Notes (Signed)
Bed alarm going off and upon arrival into the room she was attempting to get out of bed.  She states something is wrong and can not be convinced otherwise.  She is extremely agitated and hallucinating.  Arby Barrette notified via text page and he returned my call and placed order into [facility].  Orders were carried out and patient was able to calm down and return to sleep.  She still appears to be restless even in sleep but is not attempting to get out of bed. Nursing staff to continue to monitor.

## 2018-06-08 NOTE — Progress Notes (Signed)
SLP Cancellation Note  Patient Details Name: Lori Bright MRN: [id number] DOB: [birth year]   Cancelled treatment:       Reason Eval/Treat Not Completed: Other (comment). Swallow orders received. Per charting pt passed [place] and is tolerating a regular diet. RN denies difficulties with swallowing. Will s/o for swallow. Please place SLP cognitive-linguistic evaluation orders if indicated.  Deneise Lever, [place], CCC-SLP Speech-Language Pathologist Acute Rehabilitation Services Pager: [phone] Office: [phone]    Aliene Altes 06/08/2018, 10:26 AM

## 2018-06-08 NOTE — Progress Notes (Signed)
[facility] PROGRESS NOTE  Lori Bright XYI:[id number] DOB: 01/31/1924 DOA: 06/06/2018 PCP: Lori Lukes, MD   Narrative: 84 year old female Known history of sensorineural hearing loss Hypertension Hyperlipidemia Meningioma previously seen by Dr. Kathyrn Sheriff of neurosurgery Presented to hospital 11/22 with a headache slight confusion no facial droop or weakness and recent unsteady gait  Work-up in the emergency room showed a white count of 14 glucose 130 INR 1.0 EKG nonsuggestive  CT head Showed large right frontotemporal meningioma with intracranial extension 2 cm transaxially and extensive worsening of right cerebral vasogenic edema  Neurosurgery was consulted recommended IV steroids Keppra   A & Plan Mild dementia and probable sundowning-continue slightly overnight 11/23-feel however that she is clear in the mornings-monitor overnight Fall/possible seizures probably from extension of meningioma-neurosurgery saw the patient-felt CT compared to prior was not suggestive of significant mass shift and discontinued Decadron-continue Keppra changed to p.o. twice daily-discontinue IV saline Hypertension-continue amlodipine 5 mg daily rate controlled-discontinued lisinopril HCTZ Hyperlipidemia statin at this time Anemia stable 12 range Bipolar reported history of but not on any meds Moderate malnutrition-moderate secondary to BMI of 21 Elevated troponin-no chest pain is stable-no further work-up   DVT prophylaxis: lovenox  Code Status: full   Family Communication: Both family members who are at the bedside disposition Plan: Patient pending skilled placement likely a.m. 11/25   [name], MD  [facility] Direct contact: [phone] --Via amion app OR  --[email or web address]; password [email or web address]  7PM-7AM contact night coverage as above 06/08/2018, 8:07 AM  LOS: 1 day   Consultants:  No  Procedures:  No  Antimicrobials:  No  Interval history/Subjective: Awake very coherent  able to tell me date time year place country she is not short of breath no fever no chills no chest pain No overnight she got confused when her family members left   Objective:  Vitals:  Vitals:   06/08/18 0757 06/08/18 0804  BP:  (!) 185/76  Pulse:    Resp: 18 18  Temp:  98.1 F (36.7 C)  SpO2: 95%     Exam:  Awake pleasant oriented no distress EOMI NCAT No icterus no pallor Neurologically intact moving all 4 limbs equally Power 5/5 Chest is clinically clear Range of motion is intact Finger-nose-finger normal Shin to ankle testing negative No cerebellar signs  I have personally reviewed the following:   Labs:  No labs today  Imaging studies:  n  Medical tests:  n   Test discussed with performing physician:  n  Decision to obtain old records:  n  Review and summation of old records:  n  Scheduled Meds: . amLODipine  5 mg Oral Daily  . levETIRAcetam  500 mg Oral BID  . omega-3 acid ethyl esters  1 g Oral Daily   Continuous Infusions: . sodium chloride 1,000 mL (06/07/18 1524)    Principal Problem:   Acute encephalopathy Active Problems:   Meningioma (HCC)   Elevated troponin   Gait instability   HTN (hypertension)   Subdural hematoma (HCC)   LOS: 1 day

## 2018-06-08 NOTE — Progress Notes (Signed)
Patient with significant confusion overnight.  Better this morning.  Patient denies headache.  Awake and alert.  Oriented and appropriate this morning.  Motor and sensory function intact.  No witnessed seizure activity.  Overall stable.  I suspect patient's confusion last night was environmental coupled with her reaction to IV steroids.  I am hopeful this will be short-lived process and she should be able to go home possibly as early as later today.

## 2018-06-09 MED ORDER — LEVETIRACETAM 500 MG PO TABS: 500.0000 mg | ORAL_TABLET | Freq: Two times a day (BID) | ORAL | 1 refills | Status: DC

## 2018-06-09 NOTE — Discharge Summary (Signed)
Physician Discharge Summary  Ora Bollig LZJ:[id number] DOB: [birth year] DOA: 06/06/2018  PCP: Mosie Lukes, MD  Admit date: 06/06/2018 Discharge date: 06/09/2018  Time spent: 25 minutes  Recommendations for Outpatient Follow-up:  1. Start Keppra 500 twice daily and continue the same for couple of weeks until follows with neurosurgery 2. Needs baseline Chem-7 and CBC in about 1 week at facility  Discharge Diagnoses:  Principal Problem:   Acute encephalopathy Active Problems:   Meningioma (HCC)   Elevated troponin   Gait instability   HTN (hypertension)   Subdural hematoma (HCC)   Discharge Condition: Improved  Diet recommendation: Heart healthy  Filed Weights   06/06/18 2247  Weight: 59.8 kg    History of present illness:  84 year old female Known history of sensorineural hearing loss Hypertension Hyperlipidemia Meningioma previously seen by Dr. Kathyrn Sheriff of neurosurgery Presented to hospital 11/22 with a headache slight confusion no facial droop or weakness and recent unsteady gait  Work-up in the emergency room showed a white count of 14 glucose 130 INR 1.0 EKG nonsuggestive  CT head Showed large right frontotemporal meningioma with intracranial extension 2 cm transaxially and extensive worsening of right cerebral vasogenic edema  Neurosurgery was consulted recommended IV steroids Keppra  She resolved nicely over hospital stay and seem to be more coherent on discharge   Hospital Course:  Mild dementia and probable sundowning with superimposed steroid use for brain edema-on discharge she was very lucid and it seems that this may be somewhat postconcussive in etiology  Fall/possible seizures probably from extension of meningioma-neurosurgery saw the patient-felt CT compared to prior was not suggestive of significant mass shift and discontinued Decadron-continue Keppra changed to p.o. twice daily-discontinue IV saline-seemed to be about her baseline on discharge  we will send to skilled therapy  Hypertension-continue amlodipine 5 mg daily rate controlled-discontinued lisinopril HCTZ on admission but resumed on discharge  Hyperlipidemia statin at this time Anemia stable 12 range  Bipolar reported history of but not on any meds  Moderate malnutrition-moderate secondary to BMI of 21  Elevated troponin-no chest pain is stable-no further work-up  Procedures:  CT head   Consultations:  Neurosurgery Dr. Trenton Gammon  Discharge Exam: Vitals:   06/09/18 0328 06/09/18 0905  BP: (!) 118/105 (!) 119/52  Pulse: 82 89  Resp: 15 16  Temp: 97.6 F (36.4 C) (!) 97.5 F (36.4 C)  SpO2: 99% 97%    General: Awake alert pleasant no distress strength bilaterally equal EOMI NCAT Cardiovascular: S1-S2 no murmur rub or gallop Respiratory: Clinically clear no added sound Abdomen soft nontender no rebound no guarding Neurologically intact moving upper limbs and lower limbs with good strength Psych euthymic  Discharge Instructions   Discharge Instructions    Diet - low sodium heart healthy   Complete by:  As directed    Increase activity slowly   Complete by:  As directed      Allergies as of 06/09/2018   No Known Allergies     Medication List    TAKE these medications   amLODipine 5 MG tablet Commonly known as:  NORVASC TAKE 1 TABLET DAILY   cholecalciferol 25 MCG (1000 UT) tablet Commonly known as:  VITAMIN D3 Take 1,000 Units by mouth daily.   fish oil-omega-3 fatty acids 1000 MG capsule Take 1 g by mouth daily.   hydrochlorothiazide 12.5 MG capsule Commonly known as:  MICROZIDE TAKE 1 CAPSULE DAILY   levETIRAcetam 500 MG tablet Commonly known as:  KEPPRA Take 1 tablet (500 mg  total) by mouth 2 (two) times daily.   lisinopril 20 MG tablet Commonly known as:  PRINIVIL,ZESTRIL TAKE 1 TABLET DAILY      No Known Allergies    The results of significant diagnostics from this hospitalization (including imaging, microbiology,  ancillary and laboratory) are listed below for reference.    Significant Diagnostic Studies: Ct Head Wo Contrast  Result Date: 06/06/2018 CLINICAL DATA:  Fall, struck posterior head. Altered mental status and weakness. History of hypertension and hyperlipidemia. EXAM: CT HEAD WITHOUT CONTRAST TECHNIQUE: Contiguous axial images were obtained from the base of the skull through the vertex without intravenous contrast. COMPARISON:  MRI of the head January 13, 2016 FINDINGS: BRAIN: RIGHT frontotemporal calvarial mass with worsening intracranial extension to middle cranial fossa measuring 2 cm in transaxial dimension, overlying RIGHT temporal scalp invasion. Extensive RIGHT frontotemporal parietal vasogenic edema. 7 mm RIGHT to LEFT midline shift. Effaced RIGHT lateral ventricle without LEFT ventricle entrapment or hydrocephalus. No intraparenchymal hemorrhage or acute large vascular territory infarcts. Mild RIGHT uncal herniation. Basal cisterns are patent. VASCULAR: Moderate calcific atherosclerosis of the carotid siphons. SKULL: No skull fracture. RIGHT frontotemporal hyperostosis. No significant scalp soft tissue swelling. SINUSES/ORBITS: RIGHT middle ear and mastoid effusion. No air cell coalescence. Mild paranasal sinus mucosal thickening. The included ocular globes and orbital contents are non-suspicious. Status post bilateral ocular lens implants. OTHER: None. IMPRESSION: 1. New acute intracranial process. 2. Large RIGHT frontotemporal EN plaque meningioma with intracranial extension measuring to 2 cm in transaxial dimension. Extensive worsening RIGHT cerebrum vasogenic edema. 7 mm increased RIGHT to LEFT midline shift with effaced RIGHT lateral ventricle. No ventricular entrapment or hydrocephalus. Electronically Signed   By: Elon Alas M.D.   On: 06/06/2018 18:46   Dg Chest Portable 1 View  Result Date: 06/06/2018 CLINICAL DATA:  Confusion.  History of hypertension. EXAM: PORTABLE CHEST 1 VIEW  COMPARISON:  Limited correlation made with left shoulder radiographs 02/14/2015. FINDINGS: 1816 hours. The heart size is at the upper limits of normal. There is a suspected moderate-size hiatal hernia. There is mild aortic atherosclerosis. There is mild atelectasis at both lung bases adjacent to the hiatal hernia but no overt pulmonary edema or confluent airspace opacity. There is no pleural effusion or pneumothorax. There is a questionable nodular density overlapping the mid right lung. This is near the confluence of an EKG snap and the inferior right scapular tip. IMPRESSION: 1. No suspected acute findings. Borderline heart size, aortic atherosclerosis and hiatal hernia. 2. Possible right-sided pulmonary nodule. For initial assessment, a repeat frontal (PA if possible) view after removing the adjacent EKG snap is recommended. Electronically Signed   By: Richardean Sale M.D.   On: 06/06/2018 18:56    Microbiology: No results found for this or any previous visit (from the past 240 hour(s)).   Labs: Basic Metabolic Panel: Recent Labs  Lab 06/06/18 1658  NA 141  K 3.7  CL 105  CO2 28  GLUCOSE 136*  BUN 21  CREATININE 0.87  CALCIUM 9.7   Liver Function Tests: Recent Labs  Lab 06/06/18 1658  AST 31  ALT 23  ALKPHOS 53  BILITOT 0.6  PROT 7.1  ALBUMIN 4.0   No results for input(s): LIPASE, AMYLASE in the last 168 hours. No results for input(s): AMMONIA in the last 168 hours. CBC: Recent Labs  Lab 06/06/18 1658 06/07/18 0407  WBC 14.5*  14.9* 10.3  NEUTROABS 13.2*  --   HGB 13.0  12.9 12.0  HCT 41.9  43.2  38.7  MCV 96.5  97.1 95.1  PLT 232  244 225   Cardiac Enzymes: Recent Labs  Lab 06/06/18 2137 06/07/18 0407 06/07/18 1002  TROPONINI 0.73* 0.66* 0.50*   BNP: BNP (last 3 results) No results for input(s): BNP in the last 8760 hours.  ProBNP (last 3 results) No results for input(s): PROBNP in the last 8760 hours.  CBG: No results for input(s): GLUCAP in the  last 168 hours.     Signed:  Nita Sells MD   [facility] 06/09/2018, 9:16 AM

## 2018-06-09 NOTE — Progress Notes (Signed)
11/25  CSW met with patient and son-in-law at bedside. CSW informed the patient that [facility] accepted [facility] and that we are just awaiting insurance authorization and most likely discharge would occur on 06/10/18.   Sara Lee, [place]

## 2018-06-09 NOTE — Evaluation (Signed)
Speech Language Pathology Evaluation Patient Details Name: Lori Bright MRN: [id number] DOB: 12-20-1923 Today's Date: 06/09/2018 Time: 8921-1941 SLP Time Calculation (min) (ACUTE ONLY): 33 min  Problem List:  Patient Active Problem List   Diagnosis Date Noted  . Subdural hematoma ([place]) 06/07/2018  . Acute encephalopathy 06/06/2018  . Seizure ([place]) 06/06/2018  . Meningioma ([place]) 06/06/2018  . Elevated troponin 06/06/2018  . Gait instability 06/06/2018  . HTN (hypertension) 06/06/2018  . Arthritis 10/02/2017  . Insomnia 04/11/2017  . Preventative health care 10/13/2016  . SOB (shortness of breath) 10/13/2016  . Hyperglycemia 10/13/2016  . Disorder of vitamin B12 10/09/2016  . Sclerosis, diffuse ([place]) 01/15/2016  . 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 Potassium deficiency 04/25/2014  . Depression 04/25/2014  . Medicare annual wellness visit, subsequent 05/24/2013  . Parotiditis 10/18/2012  . Anemia 11/28/2011  . Cervical cancer screening 04/03/2011  . Vitamin D deficiency 03/03/2010  . MIXED HYPERLIPIDEMIA 02/07/2010  . ESSENTIAL HYPERTENSION, BENIGN 02/07/2010  . Osteoporosis 02/07/2010  . HYPOKALEMIA, HX OF 02/07/2010  . MIGRAINES, HX OF 02/07/2010   Past Medical History:  Past Medical History:  Diagnosis Date  . Anemia 11/28/2011  . Annual physical exam 05/24/2013  . Cervical cancer screening 04/03/2011  . Depression 04/25/2014  . Disorder of vitamin B12 10/09/2016  . Hyperlipidemia 11/28/2011  . Insomnia 04/11/2017  . Medicare annual wellness visit, subsequent 05/24/2013  . Parotiditis 10/18/2012  . Urinary incontinence 11/22/2013  . Wrist pain, right 04/03/2011   Past Surgical History:  Past Surgical History:  Procedure Laterality Date  . CATARACT EXTRACTION     b/l  . HEMORRHOID SURGERY    . TONSILLECTOMY     HPI:  Lori Bright is a 84 y.o. female with medical history significant of anemia, depression, hyperlipidemia, meningioma presenting to the hospital for elevation of  fall, altered mental status. Pt lives alone, active. Pt with apparent fall and subsequent MRI revealed new acute intracranial process and Large RIGHT frontotemporal EN plaque meningioma with intracranial extension measuring to 2 cm in transaxial dimension as well as extensive worsening RIGHT cerebrum vasogenic edema. 7 mm increased RIGHT to LEFT midline shift with effaced RIGHT lateral ventricle. Most recent MD note states diagnosis as mild dementia and probable sundowning with superimposed steroid use for brain edema-on discharge she was very lucid and it seems that this may be somewhat postconcussive in etiology   Assessment / Plan / Recommendation Clinical Impression  Pt is very HOH at baseline. Pt presents with mild to moderate cognitive impairments impacting higher level cognitive function. This is further supported by score of 19 out of 30 on MOCA version 7/1 (n=>30). Pt with specific deficits in solving complex problems and thus requires more than a reasonable amount of time to complete as well as multiple attempts. Additionally, she isn't able to perform simple math either verbally or with paper/pen (at baseline she was effectively managing her finances). Pt also demonstrates deficits in awareness as she disagreed with errors made during tasks. Daughter present and education provided on deficits. Pt was living independently prior to admission, therefore would recommend skilled ST at next venue of care to target problem solving tasks like medication/money management to create safe discharge plan. ST to sign off during acute phase. All education completed.     SLP Assessment  SLP Recommendation/Assessment: All further Speech Lanaguage Pathology  needs can be addressed in the next venue of care SLP Visit Diagnosis: Cognitive communication deficit (R41.841)    Follow Up Recommendations  Skilled Nursing  facility    Frequency and Duration           SLP Evaluation Cognition  Overall Cognitive  Status: Impaired/Different from baseline Arousal/Alertness: Awake/alert Orientation Level: Oriented X4 Attention: Selective Selective Attention: Impaired Selective Attention Impairment: Verbal basic;Functional basic Memory: Impaired Memory Impairment: Retrieval deficit;Decreased short term memory Decreased Short Term Memory: Verbal complex;Functional complex Awareness: Impaired Awareness Impairment: Intellectual impairment Problem Solving: Impaired Problem Solving Impairment: Verbal complex;Functional complex Executive Function: Decision Making;Reasoning;Organizing Reasoning: Impaired Reasoning Impairment: Verbal complex;Functional complex Organizing: Impaired Organizing Impairment: Verbal complex;Functional complex Decision Making: Impaired Decision Making Impairment: Verbal complex;Functional complex Safety/Judgment: Impaired Comments: no insight into difficulty wtih cognitive tasks       Comprehension  Auditory Comprehension Overall Auditory Comprehension: Appears within functional limits for tasks assessed(very [facility]) Yes/No Questions: Within Functional Limits Commands: Within Functional Limits Conversation: Simple Interfering Components: Visual impairments EffectiveTechniques: Increased volume Visual Recognition/Discrimination Discrimination: Within Function Limits Reading Comprehension Reading Status: Within funtional limits    Expression Expression Primary Mode of Expression: Verbal Verbal Expression Overall Verbal Expression: Appears within functional limits for tasks assessed Written Expression Dominant Hand: Right Written Expression: Within Functional Limits   Oral / Motor  Oral Motor/Sensory Function Overall Oral Motor/Sensory Function: Within functional limits Motor Speech Overall Motor Speech: Appears within functional limits for tasks assessed Respiration: Within functional limits Phonation: Normal Resonance: Within functional limits Articulation:  Within functional limitis Intelligibility: Intelligible Motor Planning: Witnin functional limits Motor Speech Errors: Not applicable   GO                    Lori Bright 06/09/2018, 11:08 AM

## 2018-06-09 NOTE — Progress Notes (Signed)
Physical Therapy Treatment Patient Details Name: Lori Bright MRN: [id number] DOB: 1924-06-23 Today's Date: 06/09/2018    History of Present Illness Pt is a 84 y/o female with PMH of meningioma, admitted from home on 06/06/18 after a fall and with AMS. CT head showing large R frontotemporal plaque meningioma with extension; extensive worsening R cerebrum vasogenic edema; 7 mm increased R to L midline shift with effaced R lateral ventricle. Most recent MD note states diagnosis as mild dementia and probable sundowning with superimposed steroid use for brain edema; may be somewhat postconcussive in etiology.   PT Comments    Pt progressing with mobility. Pt demonstrates generalized weakness, decreased activity tolerance and poor balance strategies, requiring consistent modA to prevent LOB in all directions while ambulating without assistive device. Initiated gait training with RW, stability improved, but still requiring intermittent minA for balance. Pt impulsive with movement and decreased safety awareness, but motivated to participate. Continue to recommend SNF-level therapies to maximize functional mobility and independence prior to return home.   Follow Up Recommendations  SNF;Supervision/Assistance - 24 hour     Equipment Recommendations  None recommended by PT    Recommendations for Other Services       Precautions / Restrictions Precautions Precautions: Fall Precaution Comments: Impulsive Restrictions Weight Bearing Restrictions: No    Mobility  Bed Mobility Overal bed mobility: Modified Independent             General bed mobility comments: HOB elevated. Impulsive, required multiple verbal/physical cues to stay seated until PT ready  Transfers Overall transfer level: Needs assistance Equipment used: 1 person hand held assist Transfers: Sit to/from Stand Sit to Stand: Min assist         General transfer comment: Impulsive, had to be physically blocked from standing  prematurely. Able to stand with minA for balance, reaching for HHA to steady; reliant on momentum to power up  Ambulation/Gait Ambulation/Gait assistance: Mod assist;Min assist   Assistive device: None;Rolling walker (2 wheeled)   Gait velocity: Decreased Gait velocity interpretation: <1.8 ft/sec, indicate of risk for recurrent falls General Gait Details: Initial ambulation without DME, requiring consistent modA to prevent LOB in all directions. Ambulated second time with use of RW, requiring intermittent minA to maintain balance. DOE 2/4   Marine scientist [place] (Stroke Patients Only)       Balance Overall balance assessment: Needs assistance Sitting-balance support: Feet supported Sitting balance-Leahy Scale: Fair     Standing balance support: During functional activity;Bilateral upper extremity supported Standing balance-Leahy Scale: Poor Standing balance comment: Requires external assist to maintain static standing                            Cognition Arousal/Alertness: Awake/alert Behavior During Therapy: Impulsive Overall Cognitive Status: Impaired/Different from baseline Area of Impairment: Attention;Memory;Following commands;Safety/judgement;Problem solving;Awareness                   Current Attention Level: Sustained Memory: Decreased short-term memory Following Commands: Follows one step commands inconsistently Safety/Judgement: Decreased awareness of deficits;Decreased awareness of safety Awareness: Intellectual Problem Solving: Difficulty sequencing;Requires verbal cues;Requires tactile cues        Exercises      General Comments General comments (skin integrity, edema, etc.): Daughter and multiple family members present      Pertinent Vitals/Pain Pain Assessment: No/denies pain    Home Living  Available Help at Discharge: Family;Available PRN/intermittently Type of Home: House               Prior Function            PT Goals (current goals can now be found in the care plan section) Acute Rehab PT Goals Patient Stated Goal: get back to independence PT Goal Formulation: With patient/family Time For Goal Achievement: 06/21/18 Potential to Achieve Goals: Good Progress towards PT goals: Progressing toward goals    Frequency    Min 2X/week      PT Plan Discharge plan needs to be updated    Co-evaluation              AM-PAC PT "6 Clicks" Mobility   Outcome Measure  Help needed turning from your back to your side while in a flat bed without using bedrails?: None Help needed moving from lying on your back to sitting on the side of a flat bed without using bedrails?: None Help needed moving to and from a bed to a chair (including a wheelchair)?: A Little Help needed standing up from a chair using your arms (e.g., wheelchair or bedside chair)?: A Little Help needed to walk in hospital room?: A Lot Help needed climbing 3-5 steps with a railing? : A Lot 6 Click Score: 18    End of Session Equipment Utilized During Treatment: Gait belt Activity Tolerance: Patient tolerated treatment well Patient left: in bed;with call bell/phone within reach;with bed alarm set;Other (comment);with family/visitor present Nurse Communication: Mobility status PT Visit Diagnosis: Other abnormalities of gait and mobility (R26.89);Difficulty in walking, not elsewhere classified (R26.2)     Time: 6415-8309 PT Time Calculation (min) (ACUTE ONLY): 20 min  Charges:  $Gait Training: 8-22 mins                    Mabeline Caras, PT, DPT Acute Rehabilitation Services  Pager [phone] Office [place] 06/09/2018, 11:19 AM

## 2018-06-10 NOTE — Clinical Social Work Placement (Signed)
Nurse to call report to [phone]     CLINICAL SOCIAL WORK PLACEMENT  NOTE  Date:  06/10/2018  Patient Details  Name: Lori Bright MRN: [id number] Date of Birth: 09-04-1923  Clinical Social Work is seeking post-discharge placement for this patient at the [place] level of care (*CSW will initial, date and re-position this form in  chart as items are completed):  Yes   Patient/family provided with [place] Work Department's list of facilities offering this level of care within the geographic area requested by the patient (or if unable, by the patient's family).  Yes   Patient/family informed of their freedom to choose among providers that offer the needed level of care, that participate in Medicare, Medicaid or managed care program needed by the patient, have an available bed and are willing to accept the patient.  Yes   Patient/family informed of 's ownership interest in [facility] and [facility], as well as of the fact that they are under no obligation to receive care at these facilities.  PASRR submitted to EDS on       PASRR number received on       Existing PASRR number confirmed on 06/08/18     FL2 transmitted to all facilities in geographic area requested by pt/family on 06/08/18     FL2 transmitted to all facilities within larger geographic area on       Patient informed that his/her managed care company has contracts with or will negotiate with certain facilities, including the following:        Yes   Patient/family informed of bed offers received.  Patient chooses bed at [facility]     Physician recommends and patient chooses bed at      Patient to be transferred to [facility] on 06/10/18.  Patient to be transferred to facility by Family car     Patient family notified on 06/10/18 of transfer.  Name of family member notified:  Son, son-in-law     PHYSICIAN       Additional Comment:     _______________________________________________ Geralynn Ochs, LCSW 06/10/2018, 11:13 AM

## 2018-06-10 NOTE — Progress Notes (Signed)
No acute overt changes to medical plan--seems stabilized for d/c to SNF when able

## 2018-06-10 NOTE — Progress Notes (Addendum)
Pt discharged to [facility] @1353 . Transported by family to facility. IV removed and Avs summary given to family.Report was given to Lattie Haw Lexicographer) at [employer] approximately 1130.

## 2018-07-24 ENCOUNTER — Ambulatory Visit
Admission: RE | Admit: 2018-07-24 | Discharge: 2018-07-24 | Disposition: A | Discharge status: Home / Self Care | Payer: Medicare Other | Source: Ambulatory Visit | Attending: Critical Care Medicine | Admitting: Critical Care Medicine

## 2018-07-24 ENCOUNTER — Encounter: Payer: Self-pay | Admitting: Neurology

## 2018-07-24 ENCOUNTER — Other Ambulatory Visit: Payer: Self-pay | Admitting: Critical Care Medicine

## 2018-07-24 DIAGNOSIS — R9389 Abnormal findings on diagnostic imaging of other specified body structures: Secondary | ICD-10-CM

## 2018-07-24 DIAGNOSIS — R222 Localized swelling, mass and lump, trunk: Secondary | ICD-10-CM

## 2018-08-08 ENCOUNTER — Encounter: Payer: Self-pay | Admitting: Neurology

## 2018-08-08 ENCOUNTER — Ambulatory Visit (INDEPENDENT_AMBULATORY_CARE_PROVIDER_SITE_OTHER): Payer: Medicare Other | Admitting: Neurology

## 2018-08-08 ENCOUNTER — Other Ambulatory Visit: Payer: Self-pay

## 2018-08-08 VITALS — BP 126/58 | HR 98 | Ht 65.0 in | Wt 131.0 lb

## 2018-08-08 DIAGNOSIS — R404 Transient alteration of awareness: Secondary | ICD-10-CM | POA: Diagnosis not present

## 2018-08-08 DIAGNOSIS — D329 Benign neoplasm of meninges, unspecified: Secondary | ICD-10-CM | POA: Diagnosis not present
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
## 2018-08-08 NOTE — Patient Instructions (Signed)
1. Schedule 1-hour EEG 2. Our office will call you with results and we can decide on next steps after the test 3. Continue to monitor symptoms, follow-up in 6 months, call for any changes

## 2018-08-18 ENCOUNTER — Ambulatory Visit (INDEPENDENT_AMBULATORY_CARE_PROVIDER_SITE_OTHER): Payer: Medicare Other | Admitting: Neurology

## 2018-08-18 DIAGNOSIS — D329 Benign neoplasm of meninges, unspecified: Secondary | ICD-10-CM | POA: Diagnosis not present

## 2018-08-18 DIAGNOSIS — R404 Transient alteration of awareness: Secondary | ICD-10-CM

## 2018-08-18 NOTE — Procedures (Signed)
ELECTROENCEPHALOGRAM REPORT  Date of Study: 08/18/2018  Patient's Name: Lori Bright MRN: [id number] Date of Birth: 17-Mar-1924  Referring Provider: Dr. Ellouise Newer  Clinical History: This is a 85 year old woman with a large right temporal meningioma with recent episode of transient alteration of awareness.  Medications: NORVASC 5 MG tablet   VITAMIN D3 25 MCG (1000 UT) tablet  MICROZIDE 12.5 MG capsule  PRINIVIL,ZESTRIL 20 MG tablet   Technical Summary: A multichannel digital 1-hour EEG recording measured by the international 10-20 system with electrodes applied with paste and impedances below 5000 ohms performed in our laboratory with EKG monitoring in an awake and asleep patient.  Hyperventilation was not performed. Photic stimulation was performed.  The digital EEG was referentially recorded, reformatted, and digitally filtered in a variety of bipolar and referential montages for optimal display.    Description: The patient is awake and asleep during the recording.  During maximal wakefulness, there is a symmetric, medium voltage 9 Hz posterior dominant rhythm that attenuates with eye opening.  The record is symmetric.  During drowsiness and sleep, there is an increase in theta slowing of the background.  Vertex waves and symmetric sleep spindles were seen.  Photic stimulation did not elicit any abnormalities.  There were no epileptiform discharges or electrographic seizures seen.    EKG lead was unremarkable.  Impression: This 1-hour awake and asleep EEG is normal.    Clinical Correlation: A normal EEG does not exclude a clinical diagnosis of epilepsy.  If further clinical questions remain, prolonged EEG may be helpful.  Clinical correlation is advised.   Ellouise Newer, M.D.

## 2018-08-19 ENCOUNTER — Telehealth: Payer: Self-pay | Admitting: Neurology

## 2018-08-19 NOTE — Telephone Encounter (Signed)
Spoke to daughter about normal EEG. We agreed that at this point, continue close clinical monitoring off medication. She is under more supervision at [facility], if any change in symptoms concerning for seizure, we can discuss seizure medication.

## 2018-09-30 ENCOUNTER — Ambulatory Visit: Payer: Medicare Other | Admitting: Neurology

## 2019-01-12 ENCOUNTER — Telehealth: Payer: Self-pay | Admitting: Neurology

## 2019-01-12 NOTE — Telephone Encounter (Signed)
Patient is at [facility] and the son in law and daughter are trying to get her seen. They said a VV would not be acceptable for her DX. What do you suggest? Thanks!

## 2019-01-13 NOTE — Telephone Encounter (Signed)
I sent this to [facility] as well, can you speak to her about it, thanks

## 2019-01-26 ENCOUNTER — Ambulatory Visit: Payer: Medicare Other | Admitting: Neurology

## 2019-03-13 ENCOUNTER — Other Ambulatory Visit: Payer: Self-pay | Admitting: Family Medicine

## 2019-03-18 ENCOUNTER — Telehealth: Payer: Self-pay | Admitting: Neurology

## 2019-03-18 NOTE — Telephone Encounter (Signed)
Patient's son, Dr. Sunnie Nielsen, called and requested a call back to schedule an appointment for his mom as soon as possible with Dr. Delice Lesch. He said she has had several recent falls that are concerning him. He reported another fall yesterday where she hit her head again and she's been very confused like last year when a similar incident happened.

## 2019-03-18 NOTE — Telephone Encounter (Signed)
Son is calling in again about patient.

## 2019-03-19 NOTE — Telephone Encounter (Signed)
Dr. Delice Lesch,  The son is requesting an appt. Looks like in the past he wants pt to be seen in the office. Where can I put the pt?

## 2019-03-20 ENCOUNTER — Other Ambulatory Visit: Payer: Self-pay | Admitting: Family Medicine

## 2019-03-20 ENCOUNTER — Other Ambulatory Visit (HOSPITAL_COMMUNITY): Payer: Self-pay | Admitting: Neurosurgery

## 2019-03-20 ENCOUNTER — Ambulatory Visit
Admission: RE | Admit: 2019-03-20 | Discharge: 2019-03-20 | Disposition: A | Discharge status: Home / Self Care | Payer: Medicare Other | Source: Ambulatory Visit | Attending: Family Medicine | Admitting: Family Medicine

## 2019-03-20 ENCOUNTER — Other Ambulatory Visit: Payer: Self-pay | Admitting: Neurosurgery

## 2019-03-20 DIAGNOSIS — R410 Disorientation, unspecified: Secondary | ICD-10-CM

## 2019-03-20 DIAGNOSIS — S0990XA Unspecified injury of head, initial encounter: Secondary | ICD-10-CM

## 2019-03-20 DIAGNOSIS — D329 Benign neoplasm of meninges, unspecified: Secondary | ICD-10-CM

## 2019-03-20 NOTE — Telephone Encounter (Signed)
Spoke to daughter and son-in-law, discussed the head CT changes with increase in meningioma size now with more edema and midline shift. They report that she is awake, gets confused sometimes, very weak, no strength in her legs, possibly more on the left, fell 5 times in a week. Sometimes having issues with incontinence. She is at [facility], staff has started coming to check in on her several times a day, PT came in. She had a fall last week, then had another fall, so head CT ordered by PCP. They are awaiting Neurosurgery call, discussed risks with surgery option (this may not be an option), starting Decadron but would need close monitoring for side effects, versus inpatient admission. They would like to wait for Neurosurg to help with decision. They know to go to hospital if she starts becoming lethargic/unresponsive.

## 2019-03-20 NOTE — Telephone Encounter (Signed)
Patient's son, Dr. Nicholas Lose, called stating he'd like to review the CT scan results with Dr. Delice Lesch for the patient from this morning.

## 2019-03-24 ENCOUNTER — Other Ambulatory Visit: Payer: Self-pay

## 2019-03-24 ENCOUNTER — Ambulatory Visit (HOSPITAL_COMMUNITY)
Admission: RE | Admit: 2019-03-24 | Discharge: 2019-03-24 | Disposition: A | Discharge status: Home / Self Care | Payer: Medicare Other | Source: Ambulatory Visit | Attending: Neurosurgery | Admitting: Neurosurgery

## 2019-03-24 DIAGNOSIS — D329 Benign neoplasm of meninges, unspecified: Secondary | ICD-10-CM

## 2019-03-24 LAB — CREATININE, SERUM
Creatinine, Ser: 0.73 mg/dL (ref 0.44–1.00)
GFR calc Af Amer: >60 mL/min (ref >60)
GFR calc non Af Amer: >60 mL/min (ref >60)

## 2019-03-24 MED ORDER — GADOBUTROL 1 MMOL/ML IV SOLN
6.0000 mL | Freq: Once | INTRAVENOUS | Status: AC | PRN
  Administered 2019-03-24: 6 mL via INTRAVENOUS

## 2019-03-26 ENCOUNTER — Telehealth: Payer: Self-pay | Admitting: Neurology

## 2019-03-26 NOTE — Telephone Encounter (Signed)
Patient's daughter called with concerns after speaking directly with Dr. Delice Lesch this morning. She'd like to speak further with Dr. Delice Lesch, if possible.

## 2019-03-26 NOTE — Telephone Encounter (Signed)
Called daughter to follow-up on patient's status. She had an MRI brain last Tues, it showed increased meningioma with severe vasogenic edema, near complete effacement of right lateral ventricle with 69mm leftward midline shift. She is seeing Dr. Kathyrn Sheriff today to discuss options. Clinically she is weak, getting help at [facility], getting confused with time but knew to pay her bills/taxes on 9/15. A little slow with processing, gets confused with time/day. No significant lethargy or unresponsiveness. Dtr will update as they know more.

## 2019-03-27 NOTE — Telephone Encounter (Signed)
Pt Son in [place] Dr Ivonne Andrew would like to speak to someone about Patient's MRI. He would like to know Dr Delice Lesch to look at the MRI and see what she thinks

## 2019-03-27 NOTE — Telephone Encounter (Signed)
Sent Dr. Tressie Ellis a mychart message informing that Dr. Delice Lesch will be out of the office until 04/06/19.  Instructed that I will have her call him when she returns. Asked if there was something specific about the imaging that he had concerns about.

## 2019-03-29 ENCOUNTER — Emergency Department (HOSPITAL_COMMUNITY): Payer: Medicare Other

## 2019-03-29 ENCOUNTER — Inpatient Hospital Stay (HOSPITAL_COMMUNITY)
Admission: EM | Admit: 2019-03-29 | Discharge: 2019-04-01 | DRG: 055 | Disposition: A | Discharge status: Hospice | Payer: Medicare Other | Attending: Neurosurgery | Admitting: Neurosurgery

## 2019-03-29 ENCOUNTER — Other Ambulatory Visit: Payer: Self-pay

## 2019-03-29 ENCOUNTER — Encounter (HOSPITAL_COMMUNITY): Payer: Self-pay | Admitting: Emergency Medicine

## 2019-03-29 DIAGNOSIS — Y92129 Unspecified place in nursing home as the place of occurrence of the external cause: Secondary | ICD-10-CM

## 2019-03-29 DIAGNOSIS — G4700 Insomnia, unspecified: Secondary | ICD-10-CM | POA: Diagnosis present

## 2019-03-29 DIAGNOSIS — D320 Benign neoplasm of cerebral meninges: Principal | ICD-10-CM | POA: Diagnosis present

## 2019-03-29 DIAGNOSIS — L8990 Pressure ulcer of unspecified site, unspecified stage: Secondary | ICD-10-CM | POA: Insufficient documentation

## 2019-03-29 DIAGNOSIS — Z20828 Contact with and (suspected) exposure to other viral communicable diseases: Secondary | ICD-10-CM | POA: Diagnosis present

## 2019-03-29 DIAGNOSIS — D329 Benign neoplasm of meninges, unspecified: Secondary | ICD-10-CM | POA: Diagnosis present

## 2019-03-29 DIAGNOSIS — I10 Essential (primary) hypertension: Secondary | ICD-10-CM | POA: Diagnosis present

## 2019-03-29 DIAGNOSIS — R296 Repeated falls: Secondary | ICD-10-CM | POA: Diagnosis present

## 2019-03-29 DIAGNOSIS — M542 Cervicalgia: Secondary | ICD-10-CM | POA: Diagnosis present

## 2019-03-29 DIAGNOSIS — W19XXXA Unspecified fall, initial encounter: Secondary | ICD-10-CM | POA: Diagnosis present

## 2019-03-29 DIAGNOSIS — Z79899 Other long term (current) drug therapy: Secondary | ICD-10-CM

## 2019-03-29 DIAGNOSIS — F329 Major depressive disorder, single episode, unspecified: Secondary | ICD-10-CM | POA: Diagnosis present

## 2019-03-29 DIAGNOSIS — Z66 Do not resuscitate: Secondary | ICD-10-CM | POA: Diagnosis present

## 2019-03-29 DIAGNOSIS — Z515 Encounter for palliative care: Secondary | ICD-10-CM | POA: Diagnosis present

## 2019-03-29 DIAGNOSIS — R32 Unspecified urinary incontinence: Secondary | ICD-10-CM | POA: Diagnosis present

## 2019-03-29 DIAGNOSIS — E785 Hyperlipidemia, unspecified: Secondary | ICD-10-CM | POA: Diagnosis present

## 2019-03-29 LAB — CBC WITH DIFFERENTIAL/PLATELET
Abs Immature Granulocytes: 0.25 10*3/uL — ABNORMAL HIGH (ref 0.00–0.07)
Basophils Absolute: 0 10*3/uL (ref 0.0–0.1)
Basophils Relative: 0 %
Eosinophils Absolute: 0 10*3/uL (ref 0.0–0.5)
Eosinophils Relative: 0 %
HCT: 45.3 % (ref 36.0–46.0)
Hemoglobin: 14.5 g/dL (ref 12.0–15.0)
Immature Granulocytes: 2 %
Lymphocytes Relative: 7 %
Lymphs Abs: 0.8 10*3/uL (ref 0.7–4.0)
MCH: 29.7 pg (ref 26.0–34.0)
MCHC: 32 g/dL (ref 30.0–36.0)
MCV: 92.8 fL (ref 80.0–100.0)
Monocytes Absolute: 0.3 10*3/uL (ref 0.1–1.0)
Monocytes Relative: 3 %
Neutro Abs: 11.5 10*3/uL — ABNORMAL HIGH (ref 1.7–7.7)
Neutrophils Relative %: 88 %
Platelets: 293 10*3/uL (ref 150–400)
RBC: 4.88 MIL/uL (ref 3.87–5.11)
RDW: 13.7 % (ref 11.5–15.5)
WBC: 12.9 10*3/uL — ABNORMAL HIGH (ref 4.0–10.5)
nRBC: 0 % (ref 0.0–0.2)

## 2019-03-29 LAB — SARS CORONAVIRUS 2 (TAT 6-24 HRS): SARS Coronavirus 2: NEGATIVE

## 2019-03-29 LAB — BASIC METABOLIC PANEL
Anion gap: 11 (ref 5–15)
BUN: 36 mg/dL — ABNORMAL HIGH (ref 8–23)
CO2: 26 mmol/L (ref 22–32)
Calcium: 9.8 mg/dL (ref 8.9–10.3)
Chloride: 106 mmol/L (ref 98–111)
Creatinine, Ser: 0.72 mg/dL (ref 0.44–1.00)
GFR calc Af Amer: >60 mL/min (ref >60)
GFR calc non Af Amer: >60 mL/min (ref >60)
Glucose, Bld: 108 mg/dL — ABNORMAL HIGH (ref 70–99)
Potassium: 4 mmol/L (ref 3.5–5.1)
Sodium: 143 mmol/L (ref 135–145)

## 2019-03-29 MED ORDER — LEVETIRACETAM 500 MG PO TABS
500.0000 mg | ORAL_TABLET | Freq: Two times a day (BID) | ORAL | Status: DC
  Administered 2019-03-29 – 2019-04-01 (×6): 500 mg via ORAL
  Filled 2019-03-29 (×6): qty 1

## 2019-03-29 MED ORDER — SENNA 8.6 MG PO TABS
1.0000 | ORAL_TABLET | Freq: Two times a day (BID) | ORAL | Status: DC
  Administered 2019-03-29 – 2019-04-01 (×6): 8.6 mg via ORAL
  Filled 2019-03-29 (×6): qty 1

## 2019-03-29 MED ORDER — ONDANSETRON HCL 4 MG/2ML IJ SOLN: 4.0000 mg | Freq: Four times a day (QID) | INTRAMUSCULAR | Status: DC | PRN

## 2019-03-29 MED ORDER — DEXAMETHASONE SODIUM PHOSPHATE 10 MG/ML IJ SOLN
10.0000 mg | Freq: Four times a day (QID) | INTRAMUSCULAR | Status: DC
  Administered 2019-03-29 – 2019-03-31 (×8): 10 mg via INTRAVENOUS
  Filled 2019-03-29 (×8): qty 1

## 2019-03-29 MED ORDER — AMLODIPINE BESYLATE 5 MG PO TABS
5.0000 mg | ORAL_TABLET | Freq: Every day | ORAL | Status: DC
  Administered 2019-03-30 – 2019-04-01 (×3): 5 mg via ORAL
  Filled 2019-03-29 (×3): qty 1

## 2019-03-29 MED ORDER — HYDROCODONE-ACETAMINOPHEN 5-325 MG PO TABS: 1.0000 | ORAL_TABLET | ORAL | Status: DC | PRN

## 2019-03-29 MED ORDER — ACETAMINOPHEN 650 MG RE SUPP: 650.0000 mg | Freq: Four times a day (QID) | RECTAL | Status: DC | PRN

## 2019-03-29 MED ORDER — ONDANSETRON HCL 4 MG PO TABS: 4.0000 mg | ORAL_TABLET | Freq: Four times a day (QID) | ORAL | Status: DC | PRN

## 2019-03-29 MED ORDER — LISINOPRIL 20 MG PO TABS
20.0000 mg | ORAL_TABLET | Freq: Every day | ORAL | Status: DC
  Administered 2019-03-30 – 2019-04-01 (×3): 20 mg via ORAL
  Filled 2019-03-29 (×3): qty 1

## 2019-03-29 MED ORDER — SODIUM CHLORIDE 0.9 % IV SOLN
INTRAVENOUS | Status: DC
  Administered 2019-03-29: 19:00:00 via INTRAVENOUS

## 2019-03-29 MED ORDER — DOCUSATE SODIUM 100 MG PO CAPS
100.0000 mg | ORAL_CAPSULE | Freq: Two times a day (BID) | ORAL | Status: DC
  Administered 2019-03-29 – 2019-04-01 (×6): 100 mg via ORAL
  Filled 2019-03-29 (×6): qty 1

## 2019-03-29 MED ORDER — HYDROCHLOROTHIAZIDE 12.5 MG PO CAPS
12.5000 mg | ORAL_CAPSULE | Freq: Every day | ORAL | Status: DC
  Administered 2019-03-30 – 2019-04-01 (×3): 12.5 mg via ORAL
  Filled 2019-03-29 (×3): qty 1

## 2019-03-29 MED ORDER — ACETAMINOPHEN 325 MG PO TABS
650.0000 mg | ORAL_TABLET | Freq: Four times a day (QID) | ORAL | Status: DC | PRN
  Administered 2019-04-01: 12:00:00 650 mg via ORAL
  Filled 2019-03-29: qty 2

## 2019-03-29 NOTE — ED Provider Notes (Signed)
[facility] EMERGENCY DEPARTMENT Provider Note   CSN: [id number] Arrival date & time: 03/29/19  1227     History   Chief Complaint Chief Complaint  Patient presents with   Fall    HPI Lori Bright is a 85 y.o. female.     The history is provided by the patient, medical records and a relative (Daughter at bedside contributing in history). No language interpreter was used.  Fall   Lori Bright is a 85 y.o. female  with a PMH as listed below including known meningioma followed by Dr. Kathyrn Sheriff who presents to the Emergency Department for evaluation after fall at facility today. She reports that she fell out of her chair and struck her head. Denies LOC. Complaining of neck pain. Denies any other injuries. Per daughter at bedside, patient has been struggling with frequent falls over the last month. She had another MRI done on 9/08 because of this that showed worsening "swelling" which her neurosurgeon thought was contributing to her weakness and falls. Her 4mg  BID decadron was increased to 8mg  BID. Per daughter, they were discussing potential surgery at most recent appointment, but no decision had been made quite yet.  Past Medical History:  Diagnosis Date   Anemia 11/28/2011   Annual physical exam 05/24/2013   Cervical cancer screening 04/03/2011   Depression 04/25/2014   Disorder of vitamin B12 10/09/2016   Hyperlipidemia 11/28/2011   Insomnia 04/11/2017   Medicare annual wellness visit, subsequent 05/24/2013   Parotiditis 10/18/2012   Urinary incontinence 11/22/2013   Wrist pain, right 04/03/2011    Patient Active Problem List   Diagnosis Date Noted   Subdural hematoma ([place]) 06/07/2018   Acute encephalopathy 06/06/2018   Seizure ([place]) 06/06/2018   Meningioma ([place]) 06/06/2018   Elevated troponin 06/06/2018   Gait instability 06/06/2018   HTN (hypertension) 06/06/2018   Arthritis 10/02/2017   Insomnia 04/11/2017   Preventative health care  10/13/2016   SOB (shortness of breath) 10/13/2016   Hyperglycemia 10/13/2016   Disorder of vitamin B12 10/09/2016   Sclerosis, diffuse ([place]) 01/15/2016   Mastoid disorder 01/15/2016   Potassium deficiency 04/25/2014   Depression 04/25/2014   Medicare annual wellness visit, subsequent 05/24/2013   Parotiditis 10/18/2012   Anemia 11/28/2011   Cervical cancer screening 04/03/2011   Vitamin D deficiency 03/03/2010   MIXED HYPERLIPIDEMIA 02/07/2010   ESSENTIAL HYPERTENSION, BENIGN 02/07/2010   Osteoporosis 02/07/2010   HYPOKALEMIA, HX OF 02/07/2010   MIGRAINES, HX OF 02/07/2010    Past Surgical History:  Procedure Laterality Date   CATARACT EXTRACTION     b/l   HEMORRHOID SURGERY     TONSILLECTOMY       OB History   No obstetric history on file.      Home Medications    Prior to Admission medications   Medication Sig Start Date End Date Taking? Authorizing Provider  amLODipine (NORVASC) 5 MG tablet TAKE 1 TABLET DAILY Patient taking differently: Take 5 mg by mouth daily.  09/25/17   Mosie Lukes, MD  cholecalciferol (VITAMIN D3) 25 MCG (1000 UT) tablet Take 1,000 Units by mouth daily.    [provider]  fish oil-omega-3 fatty acids 1000 MG capsule Take 1 g by mouth daily.     [provider]  hydrochlorothiazide (MICROZIDE) 12.5 MG capsule TAKE 1 CAPSULE DAILY Patient taking differently: Take 12.5 mg by mouth daily.  04/29/18   Mosie Lukes, MD  levETIRAcetam (KEPPRA) 500 MG tablet Take 1 tablet (500 mg  total) by mouth 2 (two) times daily. 06/09/18   Nita Sells, MD  lisinopril (PRINIVIL,ZESTRIL) 20 MG tablet TAKE 1 TABLET DAILY Patient taking differently: Take 20 mg by mouth daily.  02/20/18   Mosie Lukes, MD    Family History Family History  Problem Relation Age of Onset   Hip fracture Mother 41   Heart disease Mother        w/ mitral valve replacement   Obesity Mother    Cancer Father        hepatic/  cigar smoker   Cancer Brother        esophageal/ smoker   Glaucoma Brother    Other Daughter        early hysterectomy for menometroraghia   Deep vein thrombosis Son    Hip fracture Maternal Grandmother    Other Maternal Grandmother        bladder prolapse   Hip fracture Paternal Grandfather    Glaucoma Paternal Grandfather    Deep vein thrombosis Son     Social History Social History   Tobacco Use   Smoking status: Never Smoker   Smokeless tobacco: Never Used  Substance Use Topics   Alcohol use: Not on file    Comment: rare   Drug use: No     Allergies   Patient has no known allergies.   Review of Systems Review of Systems  Musculoskeletal: Positive for arthralgias and myalgias.  Neurological: Positive for weakness. Negative for dizziness, syncope and numbness.  All other systems reviewed and are negative.    Physical Exam Updated Vital Signs BP (!) 155/83    Pulse 68    Temp 98.6 F (37 C) (Oral)    Resp 13    SpO2 100%   Physical Exam Vitals signs and nursing note reviewed.  Constitutional:      General: She is not in acute distress.    Appearance: She is well-developed.  HENT:     Head: Normocephalic.     Comments: Atraumatic. No battle sign or racoon eyes.  Neck:     Musculoskeletal: Neck supple.     Comments: C-collar in place. + Midline tenderness. Cardiovascular:     Rate and Rhythm: Normal rate and regular rhythm.     Heart sounds: Normal heart sounds. No murmur.  Pulmonary:     Effort: Pulmonary effort is normal. No respiratory distress.     Breath sounds: Normal breath sounds.  Abdominal:     General: There is no distension.     Palpations: Abdomen is soft.     Tenderness: There is no abdominal tenderness.  Musculoskeletal:     Comments: No T/L spine tenderness. No upper or lower extremity tenderness. Pelvis stable without tenderness.  Skin:    General: Skin is warm and dry.  Neurological:     Mental Status: She is alert  and oriented to person, place, and time.     Comments: Hard of hearing, otherwise CN 2-12 grossly intact. Moves all extremities independently.       ED Treatments / Results  Labs (all labs ordered are listed, but only abnormal results are displayed) Labs Reviewed  CBC WITH DIFFERENTIAL/PLATELET - Abnormal; Notable for the following components:      Result Value   WBC 12.9 (*)    Neutro Abs 11.5 (*)    Abs Immature Granulocytes 0.25 (*)    All other components within normal limits  BASIC METABOLIC PANEL - Abnormal; Notable for the following components:  Glucose, Bld 108 (*)    BUN 36 (*)    All other components within normal limits  SARS CORONAVIRUS 2 (TAT 6-24 HRS)    EKG None  Radiology Ct Head Wo Contrast  Result Date: 03/29/2019 CLINICAL DATA:  Multiple falls. EXAM: CT HEAD WITHOUT CONTRAST CT CERVICAL SPINE WITHOUT CONTRAST TECHNIQUE: Multidetector CT imaging of the head and cervical spine was performed following the standard protocol without intravenous contrast. Multiplanar CT image reconstructions of the cervical spine were also generated. COMPARISON:  Head CT 03/20/2019.  Brain MRI 03/24/2019. FINDINGS: CT HEAD FINDINGS Brain: As on the prior study, there is substantial edema and mass-effect in the right temporoparietal region secondary to the patient's known extensive meningioma of the right lateral calvarium. The right to left midline shift measures 13 mm today compared to 14 mm on the previous MRI. No evidence for acute hemorrhage. Marked effacement of the right lateral ventricle noted as before. No abnormal extra-axial fluid collection. Vascular: No hyperdense vessel or unexpected calcification. Skull: Similar appearance of bony changes in the right temporal bone compatible with known meningioma. Sinuses/Orbits: The visualized paranasal sinuses and mastoid air cells are clear. Visualized portions of the globes and intraorbital fat are unremarkable. Other: None. CT CERVICAL  SPINE FINDINGS Alignment: Accentuated lordosis in the upper cervical spine. Trace subluxation of C4 on 5 is compatible with the facet osteoarthritis at this level. Skull base and vertebrae: No acute fracture. Bones are demineralized. Scattered focal lucencies in the vertebral bodies of the cervical spine may be related to the demineralization. Myeloma or metastatic disease considered less likely, specially given the lack of suspicious findings on head MRI without with contrast on 03/24/2019. But. Soft tissues and spinal canal: No prevertebral fluid or swelling. No visible canal hematoma. Disc levels: Loss of disc height noted posteriorly at C3-4 with endplate spurring. Remaining intervertebral disc spaces are relatively well preserved. Upper chest: Unremarkable. Other: None. IMPRESSION: 1. Similar appearance of the known right-sided meningioma with prominent edema and mass-effect in the right cerebrum generating stable 14 mm right to left midline shift compared to MRI of 03/24/2019. 2. No evidence for acute cervical spine fracture. Electronically Signed   By: Misty Stanley M.D.   On: 03/29/2019 14:36   Ct Cervical Spine Wo Contrast  Result Date: 03/29/2019 CLINICAL DATA:  Multiple falls. EXAM: CT HEAD WITHOUT CONTRAST CT CERVICAL SPINE WITHOUT CONTRAST TECHNIQUE: Multidetector CT imaging of the head and cervical spine was performed following the standard protocol without intravenous contrast. Multiplanar CT image reconstructions of the cervical spine were also generated. COMPARISON:  Head CT 03/20/2019.  Brain MRI 03/24/2019. FINDINGS: CT HEAD FINDINGS Brain: As on the prior study, there is substantial edema and mass-effect in the right temporoparietal region secondary to the patient's known extensive meningioma of the right lateral calvarium. The right to left midline shift measures 13 mm today compared to 14 mm on the previous MRI. No evidence for acute hemorrhage. Marked effacement of the right lateral  ventricle noted as before. No abnormal extra-axial fluid collection. Vascular: No hyperdense vessel or unexpected calcification. Skull: Similar appearance of bony changes in the right temporal bone compatible with known meningioma. Sinuses/Orbits: The visualized paranasal sinuses and mastoid air cells are clear. Visualized portions of the globes and intraorbital fat are unremarkable. Other: None. CT CERVICAL SPINE FINDINGS Alignment: Accentuated lordosis in the upper cervical spine. Trace subluxation of C4 on 5 is compatible with the facet osteoarthritis at this level. Skull base and vertebrae: No acute fracture. Bones  are demineralized. Scattered focal lucencies in the vertebral bodies of the cervical spine may be related to the demineralization. Myeloma or metastatic disease considered less likely, specially given the lack of suspicious findings on head MRI without with contrast on 03/24/2019. But. Soft tissues and spinal canal: No prevertebral fluid or swelling. No visible canal hematoma. Disc levels: Loss of disc height noted posteriorly at C3-4 with endplate spurring. Remaining intervertebral disc spaces are relatively well preserved. Upper chest: Unremarkable. Other: None. IMPRESSION: 1. Similar appearance of the known right-sided meningioma with prominent edema and mass-effect in the right cerebrum generating stable 14 mm right to left midline shift compared to MRI of 03/24/2019. 2. No evidence for acute cervical spine fracture. Electronically Signed   By: Misty Stanley M.D.   On: 03/29/2019 14:36    Procedures Procedures (including critical care time)  Medications Ordered in ED Medications - No data to display   Initial Impression / Assessment and Plan / ED Course  I have reviewed the triage vital signs and the nursing notes.  Pertinent labs & imaging results that were available during my care of the patient were reviewed by me and considered in my medical decision making (see chart for  details).       Effy Supernaw is a 85 y.o. female who presents to ED for evaluation after a fall where she fell out of her chair just prior to arrival at facility.  Daughter at bedside tells me that she has had frequent falls recently which has been attributed to edema 2/2 her known right meningioma.  Most recent imaging on 03/24/19 reviewed which does show large right-sided meningioma with severe vasogenic edema.  At her appointment with neurosurgery last week, her twice daily 4 mg Decadron was increased to 8 mg twice daily in hopes to improve edema and help with falls.  Daughter is concerned as she felt today that the Decadron is not helping as much as she was expecting.  On her exam today, no neurologic deficits were appreciated and she has an atraumatic exam.  She does have tenderness to her midline cervical spine and c-collar in place, therefore will obtain CT of her head and cervical spine to assess for injuries from fall today as well as worsening of her known disease.  Imaging without acute cervical spine fracture and similar appearance of her meningioma with edema and mass-effect.  Labs reviewed and overall reassuring as well. Unfortunately, patient lives in the independent living facility aspect of the facility. Daughter is very concerned about her safety and continued falls as she needs higher level of care than independently living currently. I discussed with NP Meyran of neurosurgery. Will admit to medicine and she will let Dr. Kathyrn Sheriff (patient's neurosurgeon) know so that they can see her in the morning.  Medicine, Dr. Lorin Mercy, consulted who felt neurosurgery should evaluate in the ER and medicine can consult if needed. Again reached out to NP with neurosurgery who will evaluate in ED. Care assumed by oncoming provider PA Harris who will follow up on recommendations. I do believe patient is unsafe to go back to independent living in current condition with recurrent falls.   Patient seen by and  discussed with Dr. Tyrone Nine who agrees with treatment plan.    Final Clinical Impressions(s) / ED Diagnoses   Final diagnoses:  Frequent falls  Meningioma [facility])    ED Discharge Orders    None       Gaynor Genco, Ozella Almond, PA-C 03/29/19 1548  Deno Etienne, DO 03/31/19 [id number]

## 2019-03-29 NOTE — ED Notes (Signed)
Meal tray ordered for patient.

## 2019-03-29 NOTE — ED Notes (Signed)
Patient transported to CT 

## 2019-03-29 NOTE — Progress Notes (Signed)
Pt has arrived to [facility]. Alert and oriented x 4, Identified appropriately.  VS stable, no acute distress, pt denied pain and discomfort. Pt able to move all four extremities w/o difficulties.  Oriented to room and equipment, instructed to use call bell for assistance, and call bell left within a reach. Pt verbalized understanding.   Pt in low bed and bed alarm activated.  Will continue to monitor and treat pt per MD orders.

## 2019-03-29 NOTE — Progress Notes (Signed)
Progress Note   Lori Bright [id number] DOB: June 16, 1924 DOA: 03/29/2019  PCP: Lanice Shirts, MD Consultants:  Delice Lesch - neurology; Kathyrn Sheriff - neurosurgery Patient coming from: [facility]  Chief Complaint: falls  HPI: Lori Bright is a 85 y.o. female with medical history significant of HTN;  HLD; and known R-sided meningioma with associated edema which causes balance issues presenting with frequent falls.  She was seen in the office recently by Dr. Kathyrn Sheriff and they are considering surgery.   ED Course:  Known meningioma with edema and shift.  Doubled steroids but still with frequent falls.  Lives in independent living but not SNF.  Neurosurgery recommends observation.  Considering surgery, family thinking about it.  Continuing to have falls.  Family may now want to proceed with surgery.   Past Medical History:  Diagnosis Date   Anemia 11/28/2011   Annual physical exam 05/24/2013   Cervical cancer screening 04/03/2011   Depression 04/25/2014   Disorder of vitamin B12 10/09/2016   Hyperlipidemia 11/28/2011   Insomnia 04/11/2017   Medicare annual wellness visit, subsequent 05/24/2013   Parotiditis 10/18/2012   Urinary incontinence 11/22/2013   Wrist pain, right 04/03/2011    Past Surgical History:  Procedure Laterality Date   CATARACT EXTRACTION     b/l   HEMORRHOID SURGERY     TONSILLECTOMY      Social History   Socioeconomic History   Marital status: Widowed    Spouse name: Not on file   Number of children: Not on file   Years of education: Not on file   Highest education level: Not on file  Occupational History   Not on file  Social Needs   Financial resource strain: Not on file   Food insecurity    Worry: Not on file    Inability: Not on file   Transportation needs    Medical: Not on file    Non-medical: Not on file  Tobacco Use   Smoking status: Never Smoker   Smokeless tobacco: Never Used  Substance and Sexual Activity    Alcohol use: Not on file    Comment: rare   Drug use: No   Sexual activity: Not on file  Lifestyle   Physical activity    Days per week: Not on file    Minutes per session: Not on file   Stress: Not on file  Relationships   Social connections    Talks on phone: Not on file    Gets together: Not on file    Attends religious service: Not on file    Active member of club or organization: Not on file    Attends meetings of clubs or organizations: Not on file    Relationship status: Not on file   Intimate partner violence    Fear of current or ex partner: Not on file    Emotionally abused: Not on file    Physically abused: Not on file    Forced sexual activity: Not on file  Other Topics Concern   Not on file  Social History Narrative   Pt currently resides at [facility] facility   Has 4 children   Some college education    Retired Health visitor / Theatre manager     No Known Allergies  Family History  Problem Relation Age of Onset   Hip fracture Mother 23   Heart disease Mother        w/ mitral valve replacement   Obesity Mother    Cancer  Father        hepatic/ cigar smoker   Cancer Brother        esophageal/ smoker   Glaucoma Brother    Other Daughter        early hysterectomy for menometroraghia   Deep vein thrombosis Son    Hip fracture Maternal Grandmother    Other Maternal Grandmother        bladder prolapse   Hip fracture Paternal Grandfather    Glaucoma Paternal Grandfather    Deep vein thrombosis Son     Prior to Admission medications   Medication Sig Start Date End Date Taking? Authorizing Provider  amLODipine (NORVASC) 5 MG tablet TAKE 1 TABLET DAILY Patient taking differently: Take 5 mg by mouth daily.  09/25/17   Mosie Lukes, MD  cholecalciferol (VITAMIN D3) 25 MCG (1000 UT) tablet Take 1,000 Units by mouth daily.    [provider]  fish oil-omega-3 fatty acids 1000 MG capsule  Take 1 g by mouth daily.     [provider]  hydrochlorothiazide (MICROZIDE) 12.5 MG capsule TAKE 1 CAPSULE DAILY Patient taking differently: Take 12.5 mg by mouth daily.  04/29/18   Mosie Lukes, MD  levETIRAcetam (KEPPRA) 500 MG tablet Take 1 tablet (500 mg total) by mouth 2 (two) times daily. 06/09/18   Nita Sells, MD  lisinopril (PRINIVIL,ZESTRIL) 20 MG tablet TAKE 1 TABLET DAILY Patient taking differently: Take 20 mg by mouth daily.  02/20/18   Mosie Lukes, MD    Physical Exam: Vitals:   03/29/19 1233 03/29/19 1300 03/29/19 1315 03/29/19 1330  BP: (!) 170/69 (!) 155/90 (!) 158/83 (!) 162/71  Pulse: 83 67 66 67  Resp: 16 18 12 12   Temp: 98.6 F (37 C)     TempSrc: Oral     SpO2: 100% 100% 100% 100%     Radiological Exams on Admission: Ct Head Wo Contrast  Result Date: 03/29/2019 CLINICAL DATA:  Multiple falls. EXAM: CT HEAD WITHOUT CONTRAST CT CERVICAL SPINE WITHOUT CONTRAST TECHNIQUE: Multidetector CT imaging of the head and cervical spine was performed following the standard protocol without intravenous contrast. Multiplanar CT image reconstructions of the cervical spine were also generated. COMPARISON:  Head CT 03/20/2019.  Brain MRI 03/24/2019. FINDINGS: CT HEAD FINDINGS Brain: As on the prior study, there is substantial edema and mass-effect in the right temporoparietal region secondary to the patient's known extensive meningioma of the right lateral calvarium. The right to left midline shift measures 13 mm today compared to 14 mm on the previous MRI. No evidence for acute hemorrhage. Marked effacement of the right lateral ventricle noted as before. No abnormal extra-axial fluid collection. Vascular: No hyperdense vessel or unexpected calcification. Skull: Similar appearance of bony changes in the right temporal bone compatible with known meningioma. Sinuses/Orbits: The visualized paranasal sinuses and mastoid air cells are clear. Visualized portions of the  globes and intraorbital fat are unremarkable. Other: None. CT CERVICAL SPINE FINDINGS Alignment: Accentuated lordosis in the upper cervical spine. Trace subluxation of C4 on 5 is compatible with the facet osteoarthritis at this level. Skull base and vertebrae: No acute fracture. Bones are demineralized. Scattered focal lucencies in the vertebral bodies of the cervical spine may be related to the demineralization. Myeloma or metastatic disease considered less likely, specially given the lack of suspicious findings on head MRI without with contrast on 03/24/2019. But. Soft tissues and spinal canal: No prevertebral fluid or swelling. No visible canal hematoma. Disc levels: Loss of disc  height noted posteriorly at C3-4 with endplate spurring. Remaining intervertebral disc spaces are relatively well preserved. Upper chest: Unremarkable. Other: None. IMPRESSION: 1. Similar appearance of the known right-sided meningioma with prominent edema and mass-effect in the right cerebrum generating stable 14 mm right to left midline shift compared to MRI of 03/24/2019. 2. No evidence for acute cervical spine fracture. Electronically Signed   By: Misty Stanley M.D.   On: 03/29/2019 14:36   Ct Cervical Spine Wo Contrast  Result Date: 03/29/2019 CLINICAL DATA:  Multiple falls. EXAM: CT HEAD WITHOUT CONTRAST CT CERVICAL SPINE WITHOUT CONTRAST TECHNIQUE: Multidetector CT imaging of the head and cervical spine was performed following the standard protocol without intravenous contrast. Multiplanar CT image reconstructions of the cervical spine were also generated. COMPARISON:  Head CT 03/20/2019.  Brain MRI 03/24/2019. FINDINGS: CT HEAD FINDINGS Brain: As on the prior study, there is substantial edema and mass-effect in the right temporoparietal region secondary to the patient's known extensive meningioma of the right lateral calvarium. The right to left midline shift measures 13 mm today compared to 14 mm on the previous MRI. No  evidence for acute hemorrhage. Marked effacement of the right lateral ventricle noted as before. No abnormal extra-axial fluid collection. Vascular: No hyperdense vessel or unexpected calcification. Skull: Similar appearance of bony changes in the right temporal bone compatible with known meningioma. Sinuses/Orbits: The visualized paranasal sinuses and mastoid air cells are clear. Visualized portions of the globes and intraorbital fat are unremarkable. Other: None. CT CERVICAL SPINE FINDINGS Alignment: Accentuated lordosis in the upper cervical spine. Trace subluxation of C4 on 5 is compatible with the facet osteoarthritis at this level. Skull base and vertebrae: No acute fracture. Bones are demineralized. Scattered focal lucencies in the vertebral bodies of the cervical spine may be related to the demineralization. Myeloma or metastatic disease considered less likely, specially given the lack of suspicious findings on head MRI without with contrast on 03/24/2019. But. Soft tissues and spinal canal: No prevertebral fluid or swelling. No visible canal hematoma. Disc levels: Loss of disc height noted posteriorly at C3-4 with endplate spurring. Remaining intervertebral disc spaces are relatively well preserved. Upper chest: Unremarkable. Other: None. IMPRESSION: 1. Similar appearance of the known right-sided meningioma with prominent edema and mass-effect in the right cerebrum generating stable 14 mm right to left midline shift compared to MRI of 03/24/2019. 2. No evidence for acute cervical spine fracture. Electronically Signed   By: Misty Stanley M.D.   On: 03/29/2019 14:36   Unfortunately, [facility] does not have anything to offer this patient at this time.  If the patient needs to transition to SNF, the CM/SW in the ER should be able to assist with this issue.  If the patient/family and neurosurgeon want to proceed with surgery, admission to the neurosurgery service is reasonable and [facility] would be happy to consult.   Please feel free to call back if additional consultative assistance is needed.    Karmen Bongo MD [facility]   How to contact the [facility] Attending or Consulting provider [place] or covering provider during after hours [place], for this patient?  1. Check the care team in [facility] and look for a) attending/consulting [facility] provider listed and b) the [facility] team listed 2. Log into [email or web address] and use [place] universal password to access. If you do not have the password, please contact the hospital operator. 3. Locate the [facility] provider you are looking for under [facility] and page to a number  that you can be directly reached. 4. If you still have difficulty reaching the provider, please page the [facility] (Director on Call) for the Hospitalists listed on amion for assistance.   03/29/2019, 3:36 PM

## 2019-03-29 NOTE — ED Triage Notes (Signed)
Patient in via [facility] from [facility] for multiple falls recently (over last couple weeks). Patient has a known benign tumor to R side brain with associated edema, causing balance issues. Patient states she uses a walker to get around. C/o L knee pain with EMS, but denies pain at this time. A&O x 4. Denies dizziness, headache, head or neck pain.

## 2019-03-29 NOTE — H&P (Signed)
Subjective: Patient is a 85 y.o. female who complains of frequent falls over the last couple weeks. She is a known patient of Dr. Kathyrn Sheriff who has had a large right sided meningioma for several years. She has been being taken care of outpatient with recent increase in her oral steroids. She denies any HA, NV or vision changes. Daughter reports generalized weakness that has increased over the last couple weeks. She does live at an independent living home.   Past Medical History:  Diagnosis Date  . Anemia 11/28/2011  . Annual physical exam 05/24/2013  . Cervical cancer screening 04/03/2011  . Depression 04/25/2014  . Disorder of vitamin B12 10/09/2016  . Hyperlipidemia 11/28/2011  . Insomnia 04/11/2017  . Medicare annual wellness visit, subsequent 05/24/2013  . Parotiditis 10/18/2012  . Urinary incontinence 11/22/2013  . Wrist pain, right 04/03/2011    Past Surgical History:  Procedure Laterality Date  . CATARACT EXTRACTION     b/l  . HEMORRHOID SURGERY    . TONSILLECTOMY      No Known Allergies  Social History   Tobacco Use  . Smoking status: Never Smoker  . Smokeless tobacco: Never Used  Substance Use Topics  . Alcohol use: Not on file    Comment: rare    Family History  Problem Relation Age of Onset  . Hip fracture Mother 14  . Heart disease Mother        w/ mitral valve replacement  . Obesity Mother   . Cancer Father        hepatic/ cigar smoker  . Cancer Brother        esophageal/ smoker  . Glaucoma Brother   . Other Daughter        early hysterectomy for menometroraghia  . Deep vein thrombosis Son   . Hip fracture Maternal Grandmother   . Other Maternal Grandmother        bladder prolapse  . Hip fracture Paternal Grandfather   . Glaucoma Paternal Grandfather   . Deep vein thrombosis Son    Prior to Admission medications   Medication Sig Start Date End Date Taking? Authorizing Provider  amLODipine (NORVASC) 5 MG tablet TAKE 1 TABLET DAILY Patient taking differently:  Take 5 mg by mouth daily.  09/25/17  Yes Mosie Lukes, MD  cholecalciferol (VITAMIN D3) 25 MCG (1000 UT) tablet Take 1,000 Units by mouth daily with lunch.    Yes [provider]  dexamethasone (DECADRON) 4 MG tablet Take 8 mg by mouth 2 (two) times daily with a meal. 03/20/19  Yes [provider]  hydrochlorothiazide (MICROZIDE) 12.5 MG capsule TAKE 1 CAPSULE DAILY Patient taking differently: Take 12.5 mg by mouth daily.  04/29/18  Yes Mosie Lukes, MD  lisinopril (PRINIVIL,ZESTRIL) 20 MG tablet TAKE 1 TABLET DAILY Patient taking differently: Take 20 mg by mouth daily.  02/20/18  Yes Mosie Lukes, MD  levETIRAcetam (KEPPRA) 500 MG tablet Take 1 tablet (500 mg total) by mouth 2 (two) times daily. Patient not taking: Reported on 03/29/2019 06/09/18   Nita Sells, MD     Review of Systems  Positive ROS: as above  All other systems have been reviewed and were otherwise negative with the exception of those mentioned in the HPI and as above.  Objective: Vital signs in last 24 hours: Temp:  [98.6 F (37 C)] 98.6 F (37 C) (09/13 1233) Pulse Rate:  [66-83] 68 (09/13 1500) Resp:  [12-20] 13 (09/13 1500) BP: (155-170)/(63-90) 155/83 (09/13 1500) SpO2:  [  99 %-100 %] 100 % (09/13 1500)  General Appearance: Alert, cooperative, no distress, appears stated age Head: Normocephalic, without obvious abnormality, atraumatic Eyes: PERRL, conjunctiva/corneas clear, EOM's intact, fundi benign, both eyes      Lungs:  respirations unlabored Heart: Regular rate and rhythm Pulses: 2+ and symmetric all extremities Skin: Skin color, texture, turgor normal, no rashes or lesions  NEUROLOGIC:   Mental status: alert and oriented, no aphasia, good attention span, Fund of knowledge/ memory ok, difficulty hearing Motor Exam - grossly normal Sensory Exam - grossly normal Reflexes:  Coordination - grossly normal Gait - unable to test Balance - unable to test Cranial Nerves: I:  smell Not tested  II: visual acuity  OS: na    OD: na  II: visual fields Full to confrontation  II: pupils Equal, round, reactive to light  III,VII: ptosis None  III,IV,VI: extraocular muscles  Full ROM  V: mastication Normal  V: facial light touch sensation  Normal  V,VII: corneal reflex  Present  VII: facial muscle function - upper  Normal  VII: facial muscle function - lower Normal  VIII: hearing Not tested  IX: soft palate elevation  Normal  IX,X: gag reflex Present  XI: trapezius strength  5/5  XI: sternocleidomastoid strength 5/5  XI: neck flexion strength  5/5  XII: tongue strength  Normal    Data Review Lab Results  Component Value Date   WBC 12.9 (H) 03/29/2019   HGB 14.5 03/29/2019   HCT 45.3 03/29/2019   MCV 92.8 03/29/2019   PLT 293 03/29/2019   Lab Results  Component Value Date   NA 143 03/29/2019   K 4.0 03/29/2019   CL 106 03/29/2019   CO2 26 03/29/2019   BUN 36 (H) 03/29/2019   CREATININE 0.72 03/29/2019   GLUCOSE 108 (H) 03/29/2019   Lab Results  Component Value Date   INR 1.05 06/06/2018    Assessment/Plan: 85 year old female with a known right sided meningioma follow by Dr. Kathyrn Sheriff presented to the ED with frequent falls. Large right sided meningioma with severe edema and 14 mm leftward midline shift. Do not think that she is safe to go back home to independent living with her frequent falls so we will admit her to the floor and increase her steroids. Will make Dr. Kathyrn Sheriff aware of patient and let them discuss plan of care tomorrow.   Ocie Cornfield [place] Phifer 03/29/2019 4:45 PM

## 2019-03-30 ENCOUNTER — Other Ambulatory Visit: Payer: Self-pay | Admitting: Neurosurgery

## 2019-03-30 DIAGNOSIS — L8990 Pressure ulcer of unspecified site, unspecified stage: Secondary | ICD-10-CM | POA: Insufficient documentation

## 2019-03-30 LAB — MRSA PCR SCREENING: MRSA by PCR: NEGATIVE

## 2019-03-30 NOTE — Progress Notes (Signed)
  NEUROSURGERY PROGRESS NOTE   No issues overnight. Pt admitted with worsening gait instability, falls, and worsening confusion. Known to me with right temporal meningioma with significant surrounding edema and associated MLS.  EXAM:  BP 140/69 (BP Location: Right Arm)   Pulse 72   Temp 97.9 F (36.6 C) (Oral)   Resp 18   SpO2 96%   Awake, alert, oriented  Speech fluent, appropriate  CN grossly intact x decreased hearing MAE  IMPRESSION:  85 y.o. female with large right temporal meningioma with associated edema resulting in midline shift and early trapping of contralateral ventricle. While surgery for resection is feasible, I think her age and current condition will make her postoperative recovery relatively lengthy and with extremely high risk of complication. I therefore think that transition to a more palliative approach is reasonable  PLAN: - Will consult palliative care  I have again reviewed the situation with the patient's daughter [facility]). While we did again discuss the option for surgery, I told her that Mrs. Martincic would likely require placement in a skilled nursing facility for her recovery and that she may or may not improve after surgery. In addition, the risk of postoperative complication is quite high. We also discussed the possibility of transitioning to a more palliative approach rather than aggressive surgical treatment. She seems to be more in favor of this latter option. I will therefore consult the palliative care service. All her questions this evening were answered.

## 2019-03-31 ENCOUNTER — Encounter (HOSPITAL_COMMUNITY): Payer: Self-pay | Admitting: Anesthesiology

## 2019-03-31 DIAGNOSIS — D329 Benign neoplasm of meninges, unspecified: Secondary | ICD-10-CM | POA: Diagnosis present

## 2019-03-31 DIAGNOSIS — E785 Hyperlipidemia, unspecified: Secondary | ICD-10-CM | POA: Diagnosis present

## 2019-03-31 DIAGNOSIS — M542 Cervicalgia: Secondary | ICD-10-CM | POA: Diagnosis present

## 2019-03-31 DIAGNOSIS — W19XXXA Unspecified fall, initial encounter: Secondary | ICD-10-CM | POA: Diagnosis present

## 2019-03-31 DIAGNOSIS — F329 Major depressive disorder, single episode, unspecified: Secondary | ICD-10-CM | POA: Diagnosis present

## 2019-03-31 DIAGNOSIS — R32 Unspecified urinary incontinence: Secondary | ICD-10-CM | POA: Diagnosis present

## 2019-03-31 DIAGNOSIS — Z66 Do not resuscitate: Secondary | ICD-10-CM | POA: Diagnosis present

## 2019-03-31 DIAGNOSIS — Y92129 Unspecified place in nursing home as the place of occurrence of the external cause: Secondary | ICD-10-CM | POA: Diagnosis not present

## 2019-03-31 DIAGNOSIS — D320 Benign neoplasm of cerebral meninges: Secondary | ICD-10-CM | POA: Diagnosis present

## 2019-03-31 DIAGNOSIS — Z79899 Other long term (current) drug therapy: Secondary | ICD-10-CM | POA: Diagnosis not present

## 2019-03-31 DIAGNOSIS — I10 Essential (primary) hypertension: Secondary | ICD-10-CM | POA: Diagnosis present

## 2019-03-31 DIAGNOSIS — Z20828 Contact with and (suspected) exposure to other viral communicable diseases: Secondary | ICD-10-CM | POA: Diagnosis present

## 2019-03-31 DIAGNOSIS — Z515 Encounter for palliative care: Secondary | ICD-10-CM | POA: Diagnosis present

## 2019-03-31 DIAGNOSIS — R296 Repeated falls: Secondary | ICD-10-CM | POA: Diagnosis present

## 2019-03-31 DIAGNOSIS — G4700 Insomnia, unspecified: Secondary | ICD-10-CM | POA: Diagnosis present

## 2019-03-31 MED ORDER — DEXAMETHASONE SODIUM PHOSPHATE 10 MG/ML IJ SOLN
10.0000 mg | Freq: Two times a day (BID) | INTRAMUSCULAR | Status: DC
  Administered 2019-03-31 – 2019-04-01 (×2): 10 mg via INTRAVENOUS
  Filled 2019-03-31 (×3): qty 1

## 2019-03-31 NOTE — Plan of Care (Signed)
Monitored patient for seizures and falls. Palliative consult occurred.    Problem: Clinical Measurements: Goal: Ability to maintain clinical measurements within normal limits will improve Outcome: Progressing Goal: Will remain free from infection Outcome: Progressing Goal: Diagnostic test results will improve Outcome: Progressing Goal: Respiratory complications will improve Outcome: Progressing Goal: Cardiovascular complication will be avoided Outcome: Progressing   Problem: Activity: Goal: Risk for activity intolerance will decrease Outcome: Progressing   Problem: Nutrition: Goal: Adequate nutrition will be maintained Outcome: Progressing   Problem: Coping: Goal: Level of anxiety will decrease Outcome: Progressing   Problem: Elimination: Goal: Will not experience complications related to bowel motility Outcome: Progressing Goal: Will not experience complications related to urinary retention Outcome: Progressing   Problem: Pain Managment: Goal: General experience of comfort will improve Outcome: Progressing   Problem: Safety: Goal: Ability to remain free from injury will improve Outcome: Progressing   Problem: Skin Integrity: Goal: Risk for impaired skin integrity will decrease Outcome: Progressing

## 2019-04-01 SURGERY — CRANIOTOMY TUMOR EXCISION
Anesthesia: General | Laterality: Right

## 2019-04-01 MED ORDER — MORPHINE SULFATE (CONCENTRATE) 10 MG/0.5ML PO SOLN: 20.0000 mg | ORAL | Status: DC | PRN

## 2019-04-01 MED ORDER — LORAZEPAM 2 MG/ML PO CONC: 1.0000 mg | ORAL | 0 refills | Status: AC | PRN

## 2019-04-01 MED ORDER — LORAZEPAM 2 MG/ML PO CONC: 1.0000 mg | ORAL | Status: DC | PRN

## 2019-04-01 MED ORDER — LORAZEPAM 2 MG/ML IJ SOLN
0.5000 mg | INTRAMUSCULAR | Status: AC
  Administered 2019-04-01: 15:00:00 0.5 mg via INTRAVENOUS
  Filled 2019-04-01: qty 1

## 2019-04-01 MED ORDER — MORPHINE SULFATE (PF) 2 MG/ML IV SOLN
2.0000 mg | INTRAVENOUS | Status: AC
  Administered 2019-04-01: 15:00:00 2 mg via INTRAVENOUS
  Filled 2019-04-01: qty 1

## 2019-04-01 MED ORDER — DEXAMETHASONE 4 MG PO TABS: 8.0000 mg | ORAL_TABLET | Freq: Two times a day (BID) | ORAL | 2 refills | Status: AC

## 2019-04-01 MED ORDER — MORPHINE SULFATE (CONCENTRATE) 10 MG/0.5ML PO SOLN: 20.0000 mg | ORAL | Status: AC | PRN

## 2019-04-01 NOTE — Progress Notes (Signed)
Handoff received from [place], [place]. Transport to 5c7 to be arranged.

## 2019-04-01 NOTE — Progress Notes (Addendum)
[facility])   Request to call pts daughter Lori Bright to discuss hospice options.    Lori Bright had just spoken with [facility] about her mother possibly returning.  [facility] is concerned with the level of care she would need to return (24 hour caregivers) to the ILF side.  Lori Bright is willing to pay for this should this be the best option.  Discussed [facility] appropriateness, this is an option they would want to explore.  I agree with [facility] that she may exceed safe level of care, should seizures be anticipated, I told Lori Bright this.    Family would like Dr. Hilma Favors to weigh in again.    ACC will follow up with family in AM after they speak with Dr. Hilma Favors again.  Thank you, Venia Carbon RN, BSN, [facility] Liaison (in [phone]   **addendum 1230, family spoke with Dr. Hilma Favors, want to proceed with [facility].  We have a bed today for Lori Bright.  ACC will update Case Manager once necessary paperwork is complete so ambulance transportation may be arranged.  RN staff, please call report to [phone]--bed will be assigned at this time.

## 2019-04-01 NOTE — Discharge Summary (Signed)
  Physician Discharge Summary  Patient ID: Lori Bright MRN: [id number] DOB/AGE: 1924-01-29 85 y.o.  Admit date: 03/29/2019 Discharge date: 04/01/2019  Admission Diagnoses:  Meningioma  Discharge Diagnoses:  Same Active Problems:   Meningioma ([place])   Pressure injury of skin  Discharged Condition: Stable  Hospital Course:  Lori Bright is a 85 y.o. female With known large right temporal meningioma with significant surrounding edema and associated midline shift  Who presented to the emergency room on March 29, 2019 with worsening gait instability, falls and worsening confusion. After a long discussion with family with both Dr. Kathyrn Sheriff and Dr. Hilma Favors ([place]) regarding a more comfort care approach verses surgical intervention, family elected to pursue residential hospice care.  A bed was available at [facility] placed and patient was discharged in hemodynamically stable condition.  She will continue Decadron 8 milligrams twice daily.  Treatments: Surgery - none  Discharge Exam: Blood pressure 133/73, pulse 68, temperature 97.7 F (36.5 C), resp. rate 15, SpO2 99 %. Drowsy but arouses easily Speech fluent with delayed responses CN grossly intact x decreased hearing MAE  Disposition: Discharge disposition: [place] Not Defined        Allergies as of 04/01/2019   No Known Allergies     Medication List    STOP taking these medications   levETIRAcetam 500 MG tablet Commonly known as: KEPPRA     TAKE these medications   amLODipine 5 MG tablet Commonly known as: NORVASC TAKE 1 TABLET DAILY   cholecalciferol 25 MCG (1000 UT) tablet Commonly known as: VITAMIN D3 Take 1,000 Units by mouth daily with lunch.   dexamethasone 4 MG tablet Commonly known as: DECADRON Take 2 tablets (8 mg total) by mouth 2 (two) times daily. What changed: when to take this   hydrochlorothiazide 12.5 MG capsule Commonly known as: MICROZIDE TAKE 1 CAPSULE  DAILY   lisinopril 20 MG tablet Commonly known as: ZESTRIL TAKE 1 TABLET DAILY        Signed: Traci Sermon 04/01/2019, 12:50 PM

## 2019-04-01 NOTE — Progress Notes (Addendum)
Pt given one times doses of Ativan and Morphine per MAR. Explained medication and plan to discharge to hospice to patient. No evidence of understanding. Plan for pt to transfer to [facility] bed 7 until ride to [facility] here.  1553 I called report to [facility] and updated [facility] RN

## 2019-04-01 NOTE — Progress Notes (Signed)
  NEUROSURGERY PROGRESS NOTE   Received call from Nurse Case Manager regarding patient. Palliative had discussion with family regarding home with hospice vs residential. Family elected for residential hospice. Patient to go to [facility]. Bed available today. D/C orders signed.

## 2019-04-01 NOTE — Progress Notes (Addendum)
Palliative Care  Consult Note Reason: Brain Mass  85yo with meningioma, recent falls and cognitive decline admitted with progression of mass with vasogenic edema and midline shift. Patient to frail for surgery and recommendation for palliative care made to family.  I met with patient's daughter at bedside to discuss goals of care. We discussed possible trajectories of progression, anticipated symptom management needs and care transition planning.  Lori Bright is currently living in independent living at [facility] and has a strong social support network there. Her daughter tells me how difficult it has been with Covid and the visitor restrictions.  Summary of Goals/Recommendations:  1. DNR orders placed in record, she has a living will which is not on file.  2. Currently her main deficits are motor and cognitive, she is still eating per her daughters report and she feels this is due to high doses of steroids. She can communicate but has a delay in expressing her thoughts, sleeping more, cannot walk-predominant left sided symptoms.Urinary incontinence.  3. Prognostication is difficult but the degree on midline shift on MRI is remarkable -no overt herniation signs clinically. I prepared her daughter that this could decline very rapidly and suddenly.   4. Patient's daughter is in favor of no aggressive interventions in terms of neurosurgery and we dicussed the following options:   Return to [facility] with 24/7 in home caregivers +hospice Vs.  Hospice Facility(I believe that patient will decline and need more specialized symptom management, very likely could have <2 weeks although not entirely clear how this will progress- she is at risk for herniation syndrome and EOL Seizures)  No SNF or ALF placement for EOL care.   Her daughter is going to consider these options and discuss with [facility] this afternoon. I will place hospice referral and will request hospice liaisons discuss both home  and residential hospice options.   5. Symptom management: she is currently able to take pills but unsure how long this will continue.She is on Keppra BID. PRN hydrocodone for pain. Bowel regimen.   Will follow up tomorrow.  Lane Hacker, DO Palliative Medicine [phone]  Time 50 min Greater than 50%  of this time was spent counseling and coordinating care related to the above assessment and plan.

## 2019-04-01 NOTE — Progress Notes (Signed)
NCM received consult: Hospice referral Home vs Residential. NCM reached out to daughter Langley Gauss via phone to discuss TOC needs. Langley Gauss stated unable to talk currently ... will call NCM back shortly after speaking with [facility]  ( pt's place of residence) Scientist, physiological.  Whitman Hero RN,BSN,CM [phone]

## 2019-04-01 NOTE — Progress Notes (Signed)
Patient will DC to: [facility] Anticipated DC date: 04/01/2019 Family notified: Langley Gauss( daughter) Transport by: Corey Harold   Per MD patient ready for DC today to [facility] . RN, patient, patient's family, and facility notified of DC. Discharge Summary and FL2 sent to facility. RN to call report prior to discharge. DC packet on chart. Ambulance transport requested for patient.   RNCM will sign off for now as intervention is no longer needed. Please consult [place] again if new needs arise.

## 2019-04-01 NOTE — Progress Notes (Signed)
  NEUROSURGERY PROGRESS NOTE   No issues overnight. Pt without complaint.  EXAM:  BP (!) 181/84 (BP Location: Right Arm)   Pulse 74   Temp 97.7 F (36.5 C) (Oral)   Resp 17   SpO2 100%   Drowsy but arouses easily Speech fluent with delayed responses CN grossly intact x decreased hearing MAE  IMPRESSION:  85 y.o. female with large right temporal meningioma and significant surrounding edema. No aggressive surgical mgmt planned, goal of care is palliative.  PLAN: - Appreciate Palliative care consult, family to decide in-home caregiver with hospice vs. Hospice facility - Cont steroids for now.

## 2019-04-01 NOTE — Progress Notes (Signed)
Durable DNR completed by request and verbal consent of Dr. Lane Hacker, PMT DO.   NO CHARGE  Lori Bright, [facility] Palliative Medicine Team  Phone: [phone] Fax: [phone]

## 2019-04-01 NOTE — Progress Notes (Signed)
  NEUROSURGERY PROGRESS NOTE   No issues overnight. Pt does not report any complaints.  EXAM:  BP (!) 181/84 (BP Location: Right Arm)   Pulse 74   Temp 97.7 F (36.5 C) (Oral)   Resp 17   SpO2 100%   Drowsy but arouses easily Speech fluent with delayed responses CN grossly intact x decreased hearing MAE  IMPRESSION:  85 y.o. female with large right temporal meningioma and significant surrounding edema. Do not believe she would tolerate recovery from surgical resection given her current functional status and age.  PLAN: - Appreciate Palliative care consult - Cont steroids for now.

## 2019-04-01 NOTE — Progress Notes (Signed)
Patient has declined significantly today.I am recommending hospice facility level care. Met with patient's daughter an SIL, answered their questions and provided support. Goals are full comfort. Prognosis <2 weeks. I have added SL Roxanol and Ativan for symptom management.  Bed at [facility] available today.  Time: 35 min Greater than 50%  of this time was spent counseling and coordinating care related to the above assessment and plan.   Lane Hacker, DO Palliative Medicine [phone]

## 2019-04-01 NOTE — Progress Notes (Signed)
NCM received call from pt's daughter Langley Gauss. Denise informed NCM mom's more alert today and she/family would like more access to mom once mom transition to next level of care. Denise requested NCM make referral with Environmental manager for home hospice care.  NCM spoke with Jennifer/[facility] and referral was made. Whitman Hero RN,BSN,CM [phone]

## 2019-04-01 NOTE — Evaluation (Signed)
Physical Therapy Evaluation Patient Details Name: Lael Picciotto MRN: [id number] DOB: 01-13-1924 Today's Date: 04/01/2019   History of Present Illness  Florenda Delany is a 85 y.o. female with medical history significant of HTN;  HLD; and known R-sided meningioma with associated edema which causes balance issues presenting with frequent falls.  At this time, the family is proceeding with palliative options rather than surgery.  Clinical Impression  Pt admitted with above diagnosis.  Pt currently with functional limitations due to the deficits listed below (see PT Problem List). Pt will benefit from skilled PT to increase their independence and safety with mobility to allow discharge to the venue listed below.  Pt with 2 LOB in unsupported sitting.  She was able to stand with +2 and transfer to recliner.  She presents with flat affect and some L sided inattention.  Daughter would like pt to be able to return to [facility]. She is meeting with palliative care.     Follow Up Recommendations Other (comment);Supervision for mobility/OOB(hospice (Family hoping for home at [facility]))    [facility] bed;Wheelchair (measurements PT)    Recommendations for Other Services       Precautions / Restrictions Precautions Precautions: Fall Restrictions Weight Bearing Restrictions: No      Mobility  Bed Mobility Overal bed mobility: Needs Assistance Bed Mobility: Supine to Sit     Supine to sit: Mod assist     General bed mobility comments: Pt initiating transfer to EOB, but needed A to achieve upright posture.  Transfers Overall transfer level: Needs assistance Equipment used: Rolling walker (2 wheeled) Transfers: Sit to/from [facility] Sit to Stand: Min assist;+2 physical assistance Stand pivot transfers: Min assist;Mod assist;+2 physical assistance;+2 safety/equipment       General transfer comment: Pt with initial unsteadines which improved as she  re-positioned her feet. Cues to complete turn before sitting down.  Ambulation/Gait             General Gait Details: deferred due to fatigue/weakness  Stairs            Wheelchair Mobility    Modified Rankin (Stroke Patients Only)       Balance Overall balance assessment: Needs assistance;History of Falls Sitting-balance support: Feet supported Sitting balance-Leahy Scale: Poor Sitting balance - Comments: Pt with 2 LOB posterior.  She could sit with min/guard at times, but if used UE she lost balance. Postural control: Posterior lean Standing balance support: Bilateral upper extremity supported Standing balance-Leahy Scale: Poor Standing balance comment: Requires UE support                             Pertinent Vitals/Pain Pain Assessment: No/denies pain    Home Living Family/patient expects to be discharged to:: Hospice/Palliative care               Home Equipment: Walker - 4 wheels      Prior Function Level of Independence: Independent with assistive device(s)         Comments: Amb with rollator with recent decline per daughter     Hand Dominance   Dominant Hand: Right    Extremity/Trunk Assessment   Upper Extremity Assessment Upper Extremity Assessment: Defer to OT evaluation    Lower Extremity Assessment Lower Extremity Assessment: Generalized weakness    Cervical / Trunk Assessment Cervical / Trunk Assessment: Kyphotic  Communication   Communication: No difficulties  Cognition Arousal/Alertness: Awake/alert Behavior During Therapy: Flat affect  Overall Cognitive Status: Within Functional Limits for tasks assessed Area of Impairment: Following commands;Problem solving;Safety/judgement                       Following Commands: Follows one step commands consistently Safety/Judgement: Decreased awareness of safety;Decreased awareness of deficits   Problem Solving: Slow processing;Difficulty sequencing         General Comments      Exercises     Assessment/Plan    PT Assessment Patient needs continued PT services  PT Problem List Decreased strength;Decreased activity tolerance;Decreased balance;Decreased mobility       PT Treatment Interventions DME instruction;Gait training;Functional mobility training;Neuromuscular re-education;Balance training;Therapeutic exercise;Therapeutic activities    PT Goals (Current goals can be found in the Care Plan section)  Acute Rehab PT Goals Patient Stated Goal: Daughter would like pt to be able to return to [facility] PT Goal Formulation: With family Time For Goal Achievement: 04/15/19 Potential to Achieve Goals: Fair    Frequency Min 2X/week   Barriers to discharge        Co-evaluation PT/OT/SLP Co-Evaluation/Treatment: Yes Reason for Co-Treatment: Complexity of the patient's impairments (multi-system involvement);For patient/therapist safety PT goals addressed during session: Mobility/safety with mobility;Balance         AM-PAC PT "6 Clicks" Mobility  Outcome Measure Help needed turning from your back to your side while in a flat bed without using bedrails?: A Little Help needed moving from lying on your back to sitting on the side of a flat bed without using bedrails?: A Little Help needed moving to and from a bed to a chair (including a wheelchair)?: A Lot Help needed standing up from a chair using your arms (e.g., wheelchair or bedside chair)?: A Little Help needed to walk in hospital room?: A Lot Help needed climbing 3-5 steps with a railing? : A Lot 6 Click Score: 15    End of Session Equipment Utilized During Treatment: Gait belt Activity Tolerance: Patient tolerated treatment well;Patient limited by fatigue Patient left: in chair;with call bell/phone within reach;with chair alarm set;with family/visitor present Nurse Communication: Mobility status PT Visit Diagnosis: Unsteadiness on feet (R26.81);Other abnormalities of  gait and mobility (R26.89);Muscle weakness (generalized) (M62.81);History of falling (Z91.81)    Time: [id number] PT Time Calculation (min) (ACUTE ONLY): 28 min   Charges:   PT Evaluation $PT Eval Moderate Complexity: 1 Mod          Genevive Printup L. Tamala Julian, [place] Pager [phone] 04/01/2019   Galen Manila 04/01/2019, 10:33 AM

## 2019-04-01 NOTE — Evaluation (Signed)
Occupational Therapy Evaluation Patient Details Name: Lori Bright MRN: [id number] DOB: [birth year] Today's Date: 04/01/2019    History of Present Illness Lori Bright is a 85 y.o. female with medical history significant of HTN;  HLD; and known R-sided meningioma with associated edema which causes balance issues presenting with frequent falls.  At this time, the family is proceeding with palliative options rather than surgery.   Clinical Impression   Pt admitted with the above diagnoses and presents with below problem list. Pt will benefit from continued acute OT to address the below listed deficits and maximize independence with basic ADLs prior to d/c to venue below. At baseline, pt was mod I - supervision with ADLs. Pt currently max A with UB ADLs, setup for eating, mod A +2 for LB ADLs and transfers. Some left side inattention/neglect noted, impaired balance and decreased activity tolerance. Daughter arrived to pt's room during evaluation, discussed d/c planning considerations.     Follow Up Recommendations  Supervision/Assistance - 24 hour;Other (comment)(hospice at [place] [facility] is family preference if 24/7 assist can be arrange)    [facility] bed;Wheelchair (measurements OT)    Recommendations for Other Services       Precautions / Restrictions Precautions Precautions: Fall Restrictions Weight Bearing Restrictions: No      Mobility Bed Mobility Overal bed mobility: Needs Assistance Bed Mobility: Supine to Sit     Supine to sit: Mod assist     General bed mobility comments: Pt initiating transfer to EOB, but needed A to achieve upright posture.  Transfers Overall transfer level: Needs assistance Equipment used: Rolling walker (2 wheeled) Transfers: Sit to/from [facility] Sit to Stand: Min assist;+2 physical assistance Stand pivot transfers: Min assist;Mod assist;+2 physical assistance;+2 safety/equipment       General transfer  comment: Pt with initial unsteadines which improved as she re-positioned her feet. Cues to complete turn before sitting down.    Balance Overall balance assessment: Needs assistance;History of Falls Sitting-balance support: Feet supported Sitting balance-Leahy Scale: Poor Sitting balance - Comments: Pt with 2 LOB posterior.  She could sit with min/guard at times, but if used UE she lost balance. Postural control: Posterior lean Standing balance support: Bilateral upper extremity supported Standing balance-Leahy Scale: Poor Standing balance comment: Requires UE support                           ADL either performed or assessed with clinical judgement   ADL Overall ADL's : Needs assistance/impaired Eating/Feeding: Set up;Sitting   Grooming: Maximal assistance;Sitting   Upper Body Bathing: Maximal assistance;Sitting   Lower Body Bathing: Moderate assistance;+2 for physical assistance;Sit to/from stand   Upper Body Dressing : Maximal assistance;Sitting   Lower Body Dressing: Moderate assistance;+2 for physical assistance;Sit to/from stand   Toilet Transfer: Minimal assistance;Moderate assistance;+2 for physical assistance;Stand-pivot;BSC;RW Toilet Transfer Details (indicate cue type and reason): simulated with EOB to recliner Toileting- Clothing Manipulation and Hygiene: Moderate assistance;+2 for physical assistance;Sit to/from stand         General ADL Comments: 2 LOB posteriorly sitting EOB during dynamic functional tasks     Vision Baseline Vision/History: Wears glasses       Perception Perception Perception Tested?: Yes Perception Deficits: Inattention/neglect Comments: some Left side inattention/neglect noted   Praxis      Pertinent Vitals/Pain Pain Assessment: No/denies pain     Hand Dominance Right   Extremity/Trunk Assessment Upper Extremity Assessment Upper Extremity Assessment: Generalized weakness   Lower  Extremity Assessment Lower  Extremity Assessment: Defer to PT evaluation   Cervical / Trunk Assessment Cervical / Trunk Assessment: Kyphotic   Communication Communication Communication: No difficulties   Cognition Arousal/Alertness: Awake/alert Behavior During Therapy: Flat affect Overall Cognitive Status: Impaired/Different from baseline Area of Impairment: Following commands;Problem solving;Safety/judgement                       Following Commands: Follows one step commands consistently Safety/Judgement: Decreased awareness of safety;Decreased awareness of deficits   Problem Solving: Slow processing;Difficulty sequencing     General Comments  Pt's daughter and son in law arrived during eval. Discussed PLOF and d/c planning considerations.    Exercises     Shoulder Instructions      Home Living Family/patient expects to be discharged to:: Hospice/Palliative care                             Home Equipment: Walker - 4 wheels          Prior Functioning/Environment Level of Independence: Independent with assistive device(s)        Comments: Amb with rollator with recent decline per daughter        OT Problem List: Decreased strength;Decreased activity tolerance;Impaired balance (sitting and/or standing);Decreased coordination;Decreased cognition;Decreased safety awareness;Decreased knowledge of use of DME or AE;Decreased knowledge of precautions;Pain      OT Treatment/Interventions: Self-care/ADL training;Energy conservation;DME and/or AE instruction;Therapeutic activities;Cognitive remediation/compensation;Patient/family education;Balance training    OT Goals(Current goals can be found in the care plan section) Acute Rehab OT Goals Patient Stated Goal: Daughter would like pt to be able to return to [facility] OT Goal Formulation: With patient/family Time For Goal Achievement: 04/15/19 Potential to Achieve Goals: Fair ADL Goals Pt Will Perform Grooming: with min  assist;sitting Pt Will Perform Lower Body Bathing: sit to/from stand;with mod assist Pt Will Perform Lower Body Dressing: sit to/from stand;with mod assist Pt Will Transfer to Toilet: with min assist;stand pivot transfer;bedside commode Pt Will Perform Toileting - Clothing Manipulation and hygiene: with mod assist;sit to/from stand Additional ADL Goal #1: Pt will complete bed mobility at min A level to prepare for OOB/EOB ADLs.  OT Frequency: Min 2X/week   Barriers to D/C:            Co-evaluation PT/OT/SLP Co-Evaluation/Treatment: Yes Reason for Co-Treatment: Complexity of the patient's impairments (multi-system involvement);For patient/therapist safety PT goals addressed during session: Mobility/safety with mobility;Balance OT goals addressed during session: ADL's and self-care      AM-PAC OT "6 Clicks" Daily Activity     Outcome Measure Help from another person eating meals?: A Little Help from another person taking care of personal grooming?: A Lot Help from another person toileting, which includes using toliet, bedpan, or urinal?: A Lot Help from another person bathing (including washing, rinsing, drying)?: A Lot Help from another person to put on and taking off regular upper body clothing?: A Lot Help from another person to put on and taking off regular lower body clothing?: A Lot 6 Click Score: 13   End of Session Equipment Utilized During Treatment: Gait belt;Rolling walker Nurse Communication: Mobility status(NT)  Activity Tolerance: Patient tolerated treatment well Patient left: in chair;with call bell/phone within reach;with chair alarm set;with family/visitor present  OT Visit Diagnosis: Unsteadiness on feet (R26.81);Muscle weakness (generalized) (M62.81);Other abnormalities of gait and mobility (R26.89);History of falling (Z91.81);Other symptoms and signs involving cognitive function  Time: [id number] OT Time Calculation (min): 27 min Charges:  OT  General Charges $OT Visit: 1 Visit OT Evaluation $OT Eval Low Complexity: [place], OT Acute Rehabilitation Services Pager: [phone] Office: [phone]   Hortencia Pilar 04/01/2019, 12:03 PM

## 2019-04-07 SURGERY — CRANIOPLASTY
Anesthesia: General | Laterality: Right

## 2019-04-14 NOTE — Telephone Encounter (Signed)
Sending to clinical staff for review: Okay to sign/close encounter or is further follow up needed? ° °

## 2019-04-14 NOTE — Telephone Encounter (Signed)
Dr. Delice Lesch spokw with pts daughter on 03/26/19

## 2019-04-16 DEATH — deceased

## 2019-11-02 IMAGING — CT CT CERVICAL SPINE W/O CM
2 series · 14 of 27 positions shown, 18 images · non-contrast
Comparison: Head CT 03/20/2019.  Brain MRI 03/24/2019.

CLINICAL DATA: Multiple falls.

EXAM:
CT HEAD WITHOUT CONTRAST
CT CERVICAL SPINE WITHOUT CONTRAST
TECHNIQUE: Multidetector CT imaging of the head and cervical spine was
performed following the standard protocol without intravenous
contrast. Multiplanar CT image reconstructions of the cervical spine
were also generated.

[Series 9: c_spine 2.0 sag bone · sagittal · 0.32mm/px · 5 of 61 slices shown, 6 images]
[im 21/61  bone]
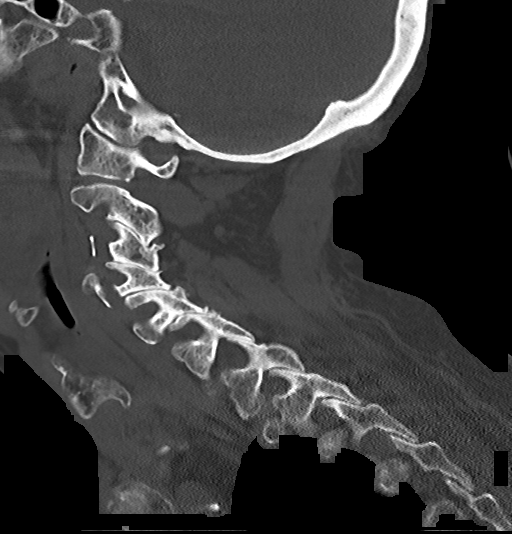
[im 26/61  bone]
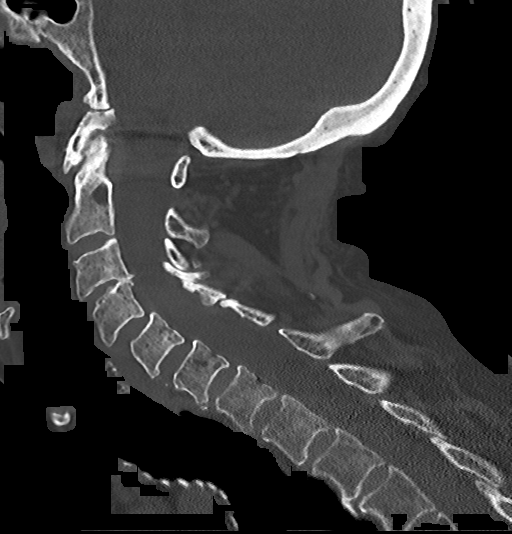
[im 31/61  soft-tissue]
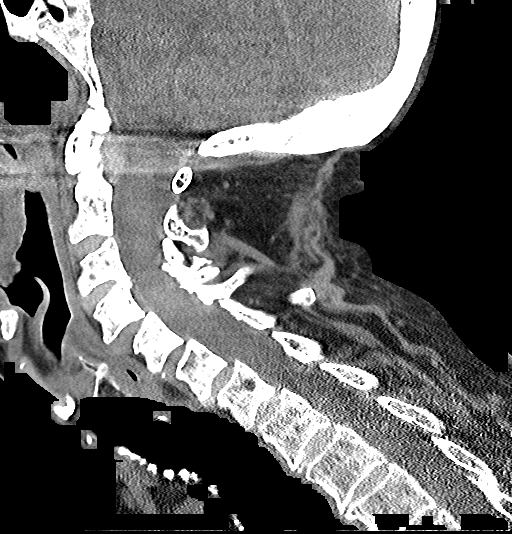
[im 31/61  bone]
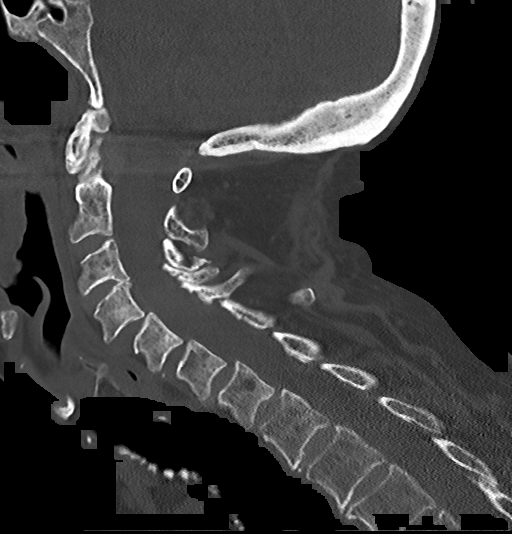
[im 36/61  bone]
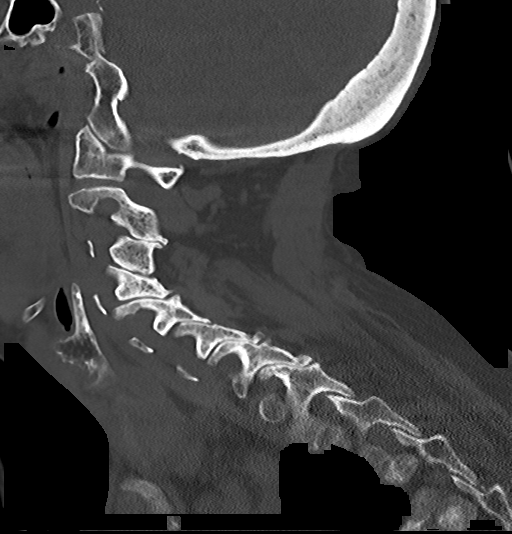
[im 41/61  bone]
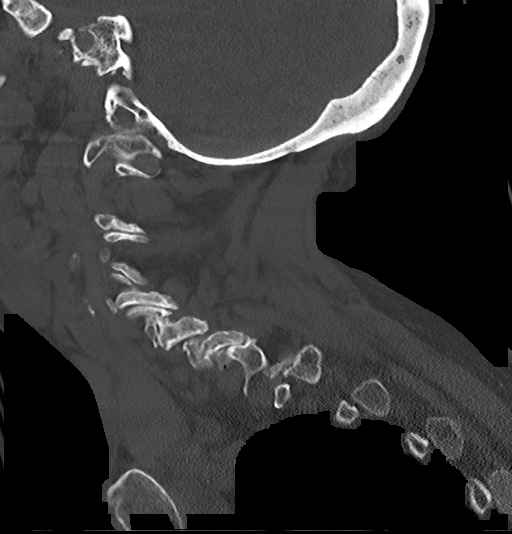

[Series 13: c_spine 1.0 st thins · axial · 0.44mm/px · z∈[-267,-122]mm · 9 of 246 slices shown, 12 images]
[im 19/246  soft-tissue]
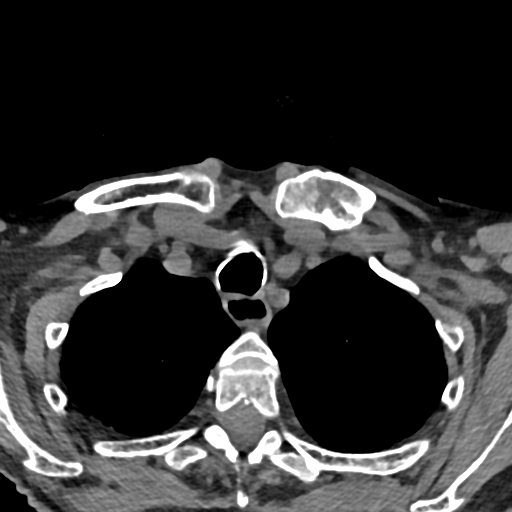
[im 19/246  bone]
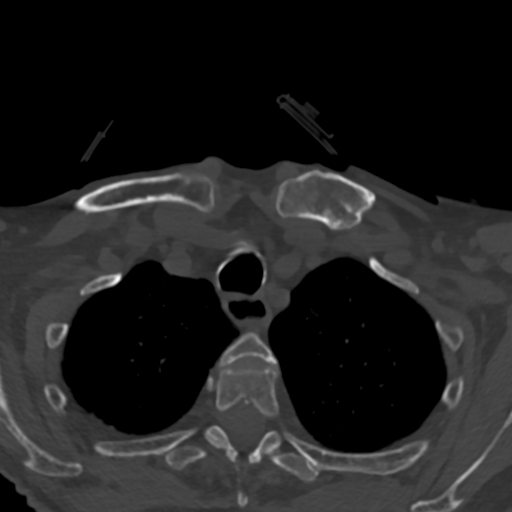
[im 57/246  bone]
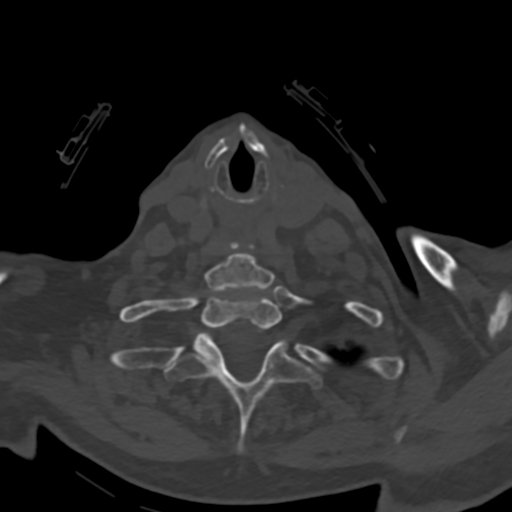
[im 76/246  bone]
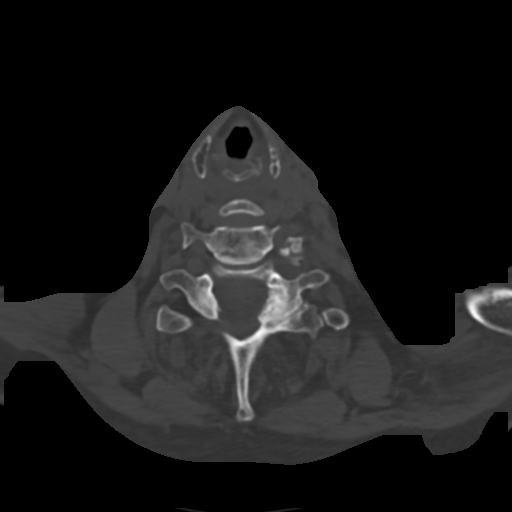
[im 95/246  bone]
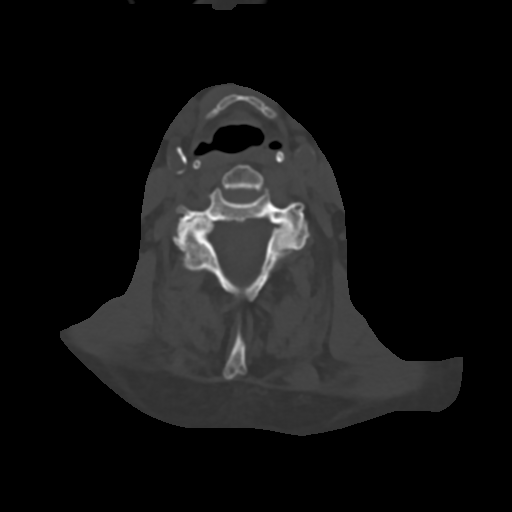
[im 132/246  soft-tissue]
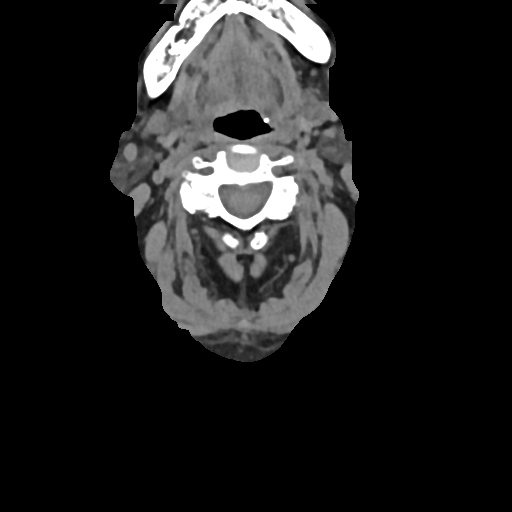
[im 132/246  bone]
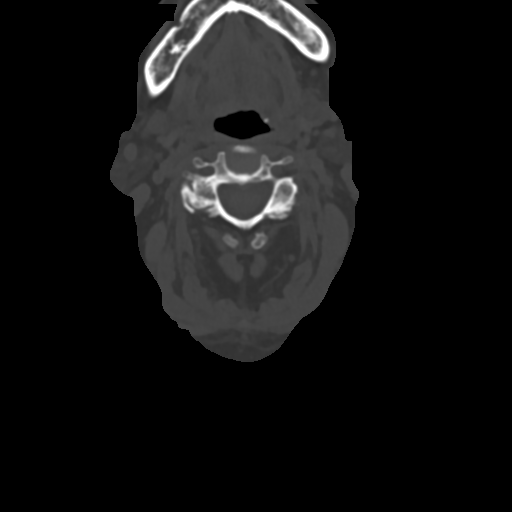
[im 151/246  bone]
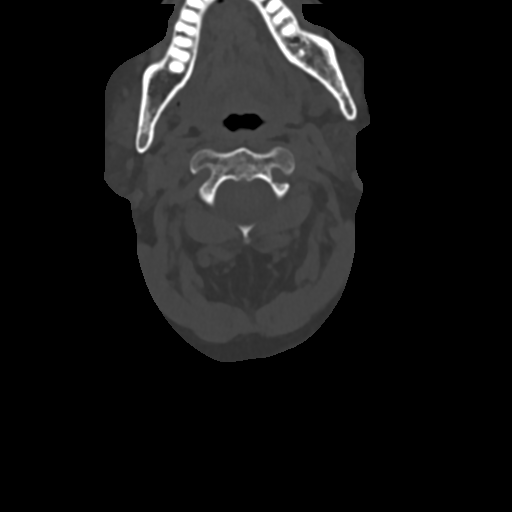
[im 170/246  bone]
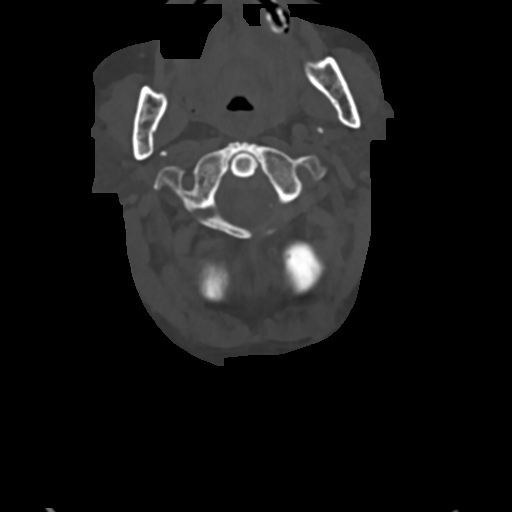
[im 208/246  bone]
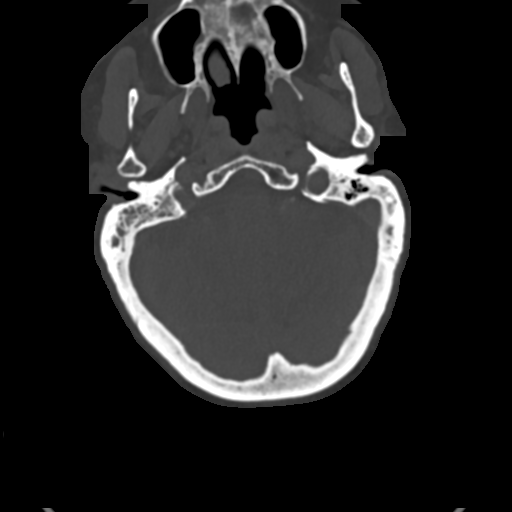
[im 227/246  soft-tissue]
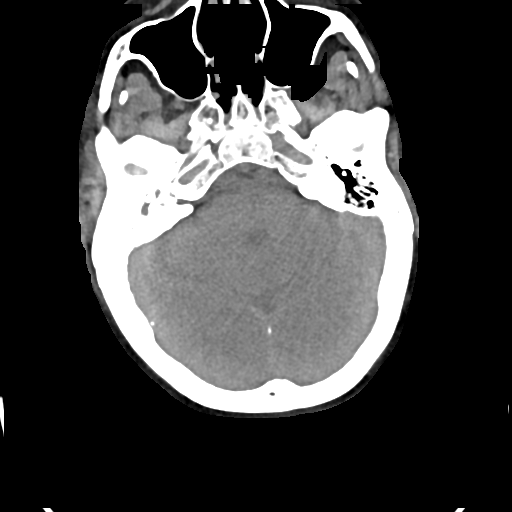
[im 227/246  bone]
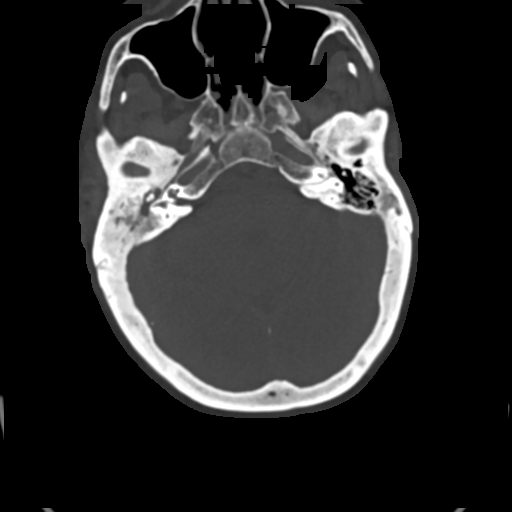

[14 of 27 positions shown; findings below may reference images not displayed]

FINDINGS: CT HEAD FINDINGS

Brain: As on the prior study, there is substantial edema and
mass-effect in the right temporoparietal region secondary to the
patient's known extensive meningioma of the right lateral calvarium.
The right to left midline shift measures 13 mm today compared to 14
mm on the previous MRI. No evidence for acute hemorrhage. Marked
effacement of the right lateral ventricle noted as before. No
abnormal extra-axial fluid collection.

Vascular: No hyperdense vessel or unexpected calcification.

Skull: Similar appearance of bony changes in the right temporal bone
compatible with known meningioma.

Sinuses/Orbits: The visualized paranasal sinuses and mastoid air
cells are clear. Visualized portions of the globes and intraorbital
fat are unremarkable.

Other: None.

CT CERVICAL SPINE FINDINGS

Alignment: Accentuated lordosis in the upper cervical spine. Trace
subluxation of C4 on 5 is compatible with the facet osteoarthritis
at this level.

Skull base and vertebrae: No acute fracture. Bones are
demineralized. Scattered focal lucencies in the vertebral bodies of
the cervical spine may be related to the demineralization. Myeloma
or metastatic disease considered less likely, specially given the
lack of suspicious findings on head MRI without with contrast on
03/24/2019.

But.

Soft tissues and spinal canal: No prevertebral fluid or swelling. No
visible canal hematoma.

Disc levels: Loss of disc height noted posteriorly at C3-4 with
endplate spurring. Remaining intervertebral disc spaces are
relatively well preserved.

Upper chest: Unremarkable.

Other: None.
IMPRESSION: 1. Similar appearance of the known right-sided meningioma with
prominent edema and mass-effect in the right cerebrum generating
stable 14 mm right to left midline shift compared to MRI of
03/24/2019.
[DATE]. No evidence for acute cervical spine fracture.
